# Patient Record
Sex: Female | Born: 1980
Health system: Southern US, Community
[De-identification: ages and names within clinical notes are randomized; demographics above are authoritative.]

## PROBLEM LIST (undated history)

## (undated) ENCOUNTER — Inpatient Hospital Stay (HOSPITAL_COMMUNITY): Payer: Self-pay

## (undated) DIAGNOSIS — Z789 Other specified health status: Secondary | ICD-10-CM

## (undated) DIAGNOSIS — M779 Enthesopathy, unspecified: Principal | ICD-10-CM

## (undated) HISTORY — DX: Enthesopathy, unspecified: M77.9

## (undated) HISTORY — PX: NO PAST SURGERIES: SHX2092

---

## 2011-03-05 ENCOUNTER — Emergency Department (HOSPITAL_BASED_OUTPATIENT_CLINIC_OR_DEPARTMENT_OTHER)
Admission: EM | Admit: 2011-03-05 | Discharge: 2011-03-05 | Disposition: A | Discharge status: Home / Self Care | Payer: No Typology Code available for payment source | Attending: Emergency Medicine | Admitting: Emergency Medicine

## 2011-03-05 ENCOUNTER — Emergency Department (INDEPENDENT_AMBULATORY_CARE_PROVIDER_SITE_OTHER): Payer: No Typology Code available for payment source

## 2011-03-05 DIAGNOSIS — Y92410 Unspecified street and highway as the place of occurrence of the external cause: Secondary | ICD-10-CM | POA: Insufficient documentation

## 2011-03-05 DIAGNOSIS — S139XXA Sprain of joints and ligaments of unspecified parts of neck, initial encounter: Secondary | ICD-10-CM | POA: Insufficient documentation

## 2011-03-05 DIAGNOSIS — M25529 Pain in unspecified elbow: Secondary | ICD-10-CM | POA: Insufficient documentation

## 2011-03-05 DIAGNOSIS — F17200 Nicotine dependence, unspecified, uncomplicated: Secondary | ICD-10-CM | POA: Insufficient documentation

## 2011-03-05 DIAGNOSIS — M25519 Pain in unspecified shoulder: Secondary | ICD-10-CM | POA: Insufficient documentation

## 2011-03-05 DIAGNOSIS — M542 Cervicalgia: Secondary | ICD-10-CM

## 2011-03-05 DIAGNOSIS — M79609 Pain in unspecified limb: Secondary | ICD-10-CM

## 2012-09-19 ENCOUNTER — Encounter (HOSPITAL_BASED_OUTPATIENT_CLINIC_OR_DEPARTMENT_OTHER): Payer: Self-pay | Admitting: *Deleted

## 2012-09-19 ENCOUNTER — Emergency Department (HOSPITAL_BASED_OUTPATIENT_CLINIC_OR_DEPARTMENT_OTHER): Payer: Self-pay

## 2012-09-19 ENCOUNTER — Emergency Department (HOSPITAL_BASED_OUTPATIENT_CLINIC_OR_DEPARTMENT_OTHER)
Admission: EM | Admit: 2012-09-19 | Discharge: 2012-09-19 | Disposition: A | Discharge status: Home / Self Care | Payer: Self-pay | Attending: Emergency Medicine | Admitting: Emergency Medicine

## 2012-09-19 DIAGNOSIS — M545 Low back pain, unspecified: Secondary | ICD-10-CM | POA: Insufficient documentation

## 2012-09-19 DIAGNOSIS — O2341 Unspecified infection of urinary tract in pregnancy, first trimester: Secondary | ICD-10-CM

## 2012-09-19 DIAGNOSIS — Z3490 Encounter for supervision of normal pregnancy, unspecified, unspecified trimester: Secondary | ICD-10-CM

## 2012-09-19 DIAGNOSIS — R110 Nausea: Secondary | ICD-10-CM | POA: Insufficient documentation

## 2012-09-19 DIAGNOSIS — O2390 Unspecified genitourinary tract infection in pregnancy, unspecified trimester: Secondary | ICD-10-CM | POA: Insufficient documentation

## 2012-09-19 DIAGNOSIS — F17200 Nicotine dependence, unspecified, uncomplicated: Secondary | ICD-10-CM | POA: Insufficient documentation

## 2012-09-19 LAB — WET PREP, GENITAL
Trich, Wet Prep: NONE SEEN
Yeast Wet Prep HPF POC: NONE SEEN

## 2012-09-19 LAB — URINALYSIS, ROUTINE W REFLEX MICROSCOPIC
Bilirubin Urine: NEGATIVE
Glucose, UA: NEGATIVE mg/dL
Ketones, ur: 15 mg/dL — AB
pH: 6.5 (ref 5.0–8.0)

## 2012-09-19 LAB — URINE MICROSCOPIC-ADD ON

## 2012-09-19 MED ORDER — NITROFURANTOIN MONOHYD MACRO 100 MG PO CAPS: 100.0000 mg | ORAL_CAPSULE | Freq: Two times a day (BID) | ORAL | Status: DC

## 2012-09-19 MED ORDER — ONDANSETRON HCL 4 MG PO TABS: 4.0000 mg | ORAL_TABLET | Freq: Four times a day (QID) | ORAL | Status: DC

## 2012-09-19 NOTE — ED Notes (Signed)
States she had a positive pregnancy test this am. C.o back pain in the mornings when she wakes up x 4 days.

## 2012-09-19 NOTE — ED Provider Notes (Addendum)
History     CSN: [id number]  Arrival date & time 09/19/12  1733   First MD Initiated Contact with Patient 09/19/12 1741      Chief Complaint  Patient presents with  . Abdominal Pain    (Consider location/radiation/quality/duration/timing/severity/associated sxs/prior treatment) Patient is a 31 y.o. female presenting with abdominal pain. The history is provided by the patient.  Abdominal Pain The primary symptoms of the illness include abdominal pain and nausea. The primary symptoms of the illness do not include vomiting, dysuria, vaginal discharge or vaginal bleeding. Episode onset: 4 days ago. The onset of the illness was gradual. Progression since onset: intermittent.  Pain Location: pelvic area. The abdominal pain does not radiate. The severity of the abdominal pain is 5/10. The abdominal pain is relieved by nothing. Exacerbated by: nothing.  The patient states that she believes she is currently pregnant. Additional symptoms associated with the illness include anorexia and back pain. Symptoms associated with the illness do not include chills, constipation, urgency or frequency.    History reviewed. No pertinent past medical history.  History reviewed. No pertinent past surgical history.  No family history on file.  History  Substance Use Topics  . Smoking status: Current Every Day Smoker -- 0.5 packs/day    Types: Cigarettes  . Smokeless tobacco: Not on file  . Alcohol Use: Yes    OB History    Grav Para Term Preterm Abortions TAB SAB Ect Mult Living   1               Review of Systems  Constitutional: Negative for chills.  Gastrointestinal: Positive for nausea, abdominal pain and anorexia. Negative for vomiting and constipation.  Genitourinary: Negative for dysuria, urgency, frequency, vaginal bleeding and vaginal discharge.  Musculoskeletal: Positive for back pain.  All other systems reviewed and are negative.    Allergies  Review of patient's allergies  indicates no known allergies.  Home Medications  No current outpatient prescriptions on file.  BP 121/73  Pulse 86  Temp 98.4 F (36.9 C) (Oral)  Resp 20  SpO2 100%  LMP 08/07/2012  Physical Exam  Nursing note and vitals reviewed. Constitutional: She is oriented to person, place, and time. She appears well-developed and well-nourished. No distress.  HENT:  Head: Normocephalic and atraumatic.  Mouth/Throat: Oropharynx is clear and moist.  Eyes: Conjunctivae normal and EOM are normal. Pupils are equal, round, and reactive to light.  Neck: Normal range of motion. Neck supple.  Cardiovascular: Normal rate, regular rhythm and intact distal pulses.   No murmur heard. Pulmonary/Chest: Effort normal and breath sounds normal. No respiratory distress. She has no wheezes. She has no rales.  Abdominal: Soft. She exhibits no distension. There is no tenderness. There is no rebound, no guarding and no CVA tenderness.       No reproducible abd pain  Genitourinary: Vagina normal and uterus normal. Cervix exhibits no motion tenderness, no discharge and no friability. Right adnexum displays no mass, no tenderness and no fullness. Left adnexum displays no mass, no tenderness and no fullness. No vaginal discharge found.  Musculoskeletal: Normal range of motion. She exhibits no edema and no tenderness.       Lumbar back: She exhibits tenderness.       Back:       Mild tenderness with palpation in the paralumbar region  Neurological: She is alert and oriented to person, place, and time.  Skin: Skin is warm and dry. No rash noted. No erythema.  Psychiatric:  She has a normal mood and affect. Her behavior is normal.    ED Course  Procedures (including critical care time)  Labs Reviewed  URINALYSIS, ROUTINE W REFLEX MICROSCOPIC - Abnormal; Notable for the following:    APPearance CLOUDY (*)     Ketones, ur 15 (*)     Leukocytes, UA SMALL (*)     All other components within normal limits    PREGNANCY, URINE - Abnormal; Notable for the following:    Preg Test, Ur POSITIVE (*)     All other components within normal limits  WET PREP, GENITAL - Abnormal; Notable for the following:    Clue Cells Wet Prep HPF POC MODERATE (*)     WBC, Wet Prep HPF POC RARE (*)     All other components within normal limits  HCG, QUANTITATIVE, PREGNANCY - Abnormal; Notable for the following:    hCG, Beta Chain, Quant, S 19290 (*)     All other components within normal limits  URINE MICROSCOPIC-ADD ON - Abnormal; Notable for the following:    Squamous Epithelial / LPF FEW (*)     All other components within normal limits  GC/CHLAMYDIA PROBE AMP, GENITAL   US Ob Comp Less 14 Wks  09/19/2012  *RADIOLOGY REPORT*  Clinical Data: Abdominal cramping.  Positive pregnancy test.  OBSTETRIC <14 WK ULTRASOUND, TRANSVAGINAL OB US  Technique:  Transabdominal and transvaginal ultrasound was performed for evaluation of the gestation as well as the maternal uterus and adnexal regions.  Number of gestation: 1 Heart Rate: 155 bpm  CRL:  3.7 mm         6w  0d                [place] EDC: 05/15/2013  Maternal uterus/adnexae: No subchorionic hemorrhage.  The left ovary is normal measuring 2.2 x 1.2 x 1.9 cm.  The right ovary also appears normal measuring 4.6 x 2.5 x 2.3 cm.  Fibroid noted within the anterior fundus measuring 1.9 x 0.9 x 2.0 cm.  No free fluid.  IMPRESSION:  1.  Single living intrauterine gestation with an estimated gestational age of [redacted] weeks and 0 days.   Original Report Authenticated By: Rosealee Albee, M.D.    US Ob Transvaginal  09/19/2012  *RADIOLOGY REPORT*  Clinical Data: Abdominal cramping.  Positive pregnancy test.  OBSTETRIC <14 WK ULTRASOUND, TRANSVAGINAL OB US  Technique:  Transabdominal and transvaginal ultrasound was performed for evaluation of the gestation as well as the maternal uterus and adnexal regions.  Number of gestation: 1 Heart Rate: 155 bpm  CRL:  3.7 mm         6w  0d                [place]  EDC: 05/15/2013  Maternal uterus/adnexae: No subchorionic hemorrhage.  The left ovary is normal measuring 2.2 x 1.2 x 1.9 cm.  The right ovary also appears normal measuring 4.6 x 2.5 x 2.3 cm.  Fibroid noted within the anterior fundus measuring 1.9 x 0.9 x 2.0 cm.  No free fluid.  IMPRESSION:  1.  Single living intrauterine gestation with an estimated gestational age of [redacted] weeks and 0 days.   Original Report Authenticated By: Rosealee Albee, M.D.      1. UTI (urinary tract infection) in pregnancy in first trimester   2. Pregnancy       MDM   Patient complaining of abdominal cramping and lower back pain that started 4 days ago. She  took a pregnancy test this a.m. and it was positive. Last normal menses was 08/10/2012.   Patient denies any vaginal bleeding, discharge or dysuria. Her exam is benign today. Unable to see the fetus by bedside abdominal ultrasound.  UA, UPT, wet prep, GC Chlamydia, hCG, transvaginal ultrasound pending.   6:30 PM Pelvic exam unrevealing. UA with small leukocytes and 3-6 white blood cells be given she is pregnant we'll treat for UTI.  U/s showed single IUP with fetal heart rate noted. Pt given OB f/u and started on macrobid      Gwyneth Sprout, MD 09/19/12 1945  Gwyneth Sprout, MD 09/19/12 [id number]

## 2012-09-20 LAB — GC/CHLAMYDIA PROBE AMP, GENITAL
Chlamydia, DNA Probe: NEGATIVE
GC Probe Amp, Genital: NEGATIVE

## 2013-08-27 ENCOUNTER — Encounter (HOSPITAL_BASED_OUTPATIENT_CLINIC_OR_DEPARTMENT_OTHER): Payer: Self-pay | Admitting: *Deleted

## 2013-08-27 ENCOUNTER — Emergency Department (HOSPITAL_BASED_OUTPATIENT_CLINIC_OR_DEPARTMENT_OTHER)
Admission: EM | Admit: 2013-08-27 | Discharge: 2013-08-27 | Disposition: A | Discharge status: Home / Self Care | Payer: Medicaid Other | Attending: Emergency Medicine | Admitting: Emergency Medicine

## 2013-08-27 ENCOUNTER — Emergency Department (HOSPITAL_BASED_OUTPATIENT_CLINIC_OR_DEPARTMENT_OTHER): Payer: Medicaid Other

## 2013-08-27 DIAGNOSIS — M549 Dorsalgia, unspecified: Secondary | ICD-10-CM

## 2013-08-27 DIAGNOSIS — M546 Pain in thoracic spine: Secondary | ICD-10-CM | POA: Insufficient documentation

## 2013-08-27 DIAGNOSIS — R0602 Shortness of breath: Secondary | ICD-10-CM | POA: Insufficient documentation

## 2013-08-27 DIAGNOSIS — F17200 Nicotine dependence, unspecified, uncomplicated: Secondary | ICD-10-CM | POA: Insufficient documentation

## 2013-08-27 NOTE — ED Provider Notes (Signed)
CSN: [id number]     Arrival date & time 08/27/13  1253 History   First MD Initiated Contact with Patient 08/27/13 1303     Chief Complaint  Patient presents with  . Back Pain   (Consider location/radiation/quality/duration/timing/severity/associated sxs/prior Treatment) HPI Comments: 32 yo female with no significant medical hx, pt smoker presents with back pain and sob.  Pt has had upper back ache for one week, worse with movement, no injuries recalled.  The past two days she feels it is more difficulty with breathing only when she lies down.  No recent infection.  No blood clot or cardiac hx.  No cp.  Patient denies blood clot history, active cancer, recent major trauma or surgery, unilateral leg swelling/ pain, recent long travel, hemoptysis.    Patient is a 32 y.o. female presenting with back pain. The history is provided by the patient.  Back Pain Location:  Thoracic spine Associated symptoms: no abdominal pain, no chest pain, no dysuria, no fever and no headaches     History reviewed. No pertinent past medical history. History reviewed. No pertinent past surgical history. History reviewed. No pertinent family history. History  Substance Use Topics  . Smoking status: Current Every Day Smoker -- 0.50 packs/day    Types: Cigarettes  . Smokeless tobacco: Not on file  . Alcohol Use: No   OB History   Grav Para Term Preterm Abortions TAB SAB Ect Mult Living   1              Review of Systems  Constitutional: Negative for fever and chills.  HENT: Negative for neck pain and neck stiffness.   Eyes: Negative for visual disturbance.  Respiratory: Positive for shortness of breath.   Cardiovascular: Negative for chest pain.  Gastrointestinal: Negative for vomiting and abdominal pain.  Genitourinary: Negative for dysuria and flank pain.  Musculoskeletal: Positive for back pain.  Skin: Negative for rash.  Neurological: Negative for light-headedness and headaches.    Allergies   Review of patient's allergies indicates no known allergies.  Home Medications   Current Outpatient Rx  Name  Route  Sig  Dispense  Refill  . ibuprofen (ADVIL,MOTRIN) 400 MG tablet   Oral   Take 400 mg by mouth every 6 (six) hours as needed for pain.          BP 133/83  Pulse 78  Temp(Src) 98.3 F (36.8 C) (Oral)  Resp 16  Ht 5\' 3"  (1.6 m)  Wt 145 lb (65.772 kg)  BMI 25.69 kg/m2  SpO2 95%  LMP 08/05/2012  Breastfeeding? Unknown Physical Exam  Nursing note and vitals reviewed. Constitutional: She is oriented to person, place, and time. She appears well-developed and well-nourished.  HENT:  Head: Normocephalic and atraumatic.  Eyes: Conjunctivae are normal. Right eye exhibits no discharge. Left eye exhibits no discharge.  Neck: Normal range of motion. Neck supple. No tracheal deviation present.  Cardiovascular: Normal rate and regular rhythm.   Pulmonary/Chest: Effort normal and breath sounds normal.  Abdominal: Soft. She exhibits no distension. There is no tenderness. There is no guarding.  Musculoskeletal: She exhibits tenderness (mild paraspinal thoracic). She exhibits no edema.  Neurological: She is alert and oriented to person, place, and time. She has normal strength. GCS eye subscore is 4. GCS verbal subscore is 5. GCS motor subscore is 6.  5+ strength in UE and LE with f/e at major joints. Sensation to palpation intact in UE and LE  Skin: Skin is warm. No rash noted.  Psychiatric: She has a normal mood and affect.    ED Course  Procedures (including critical care time) Labs Review Labs Reviewed - No data to display Imaging Review No results found.  MDM  No diagnosis found. Sxs only when lying flat.   Likely msk with pt overall health except smoking hx.   Pt has no cp or sob in ED.l  Vitals normal.  CXR no acute findings, PERC neg, no risks for PE. Discussed if patient sxs persist or worsen she will need further eval for PE however at this time suspicion  very  Low/ pretest prob very low.  Close fup stressed, pt comfortable with plan.  Back strain thoracic DC   Enid Skeens, MD 08/31/13 2119

## 2013-08-27 NOTE — ED Notes (Signed)
Pt c/o back pain and SOB x 1 week

## 2013-10-05 ENCOUNTER — Ambulatory Visit: Payer: Medicaid Other | Admitting: Internal Medicine

## 2014-09-27 ENCOUNTER — Encounter (HOSPITAL_BASED_OUTPATIENT_CLINIC_OR_DEPARTMENT_OTHER): Payer: Self-pay | Admitting: *Deleted

## 2015-04-16 ENCOUNTER — Emergency Department (HOSPITAL_BASED_OUTPATIENT_CLINIC_OR_DEPARTMENT_OTHER): Payer: Medicaid Other

## 2015-04-16 ENCOUNTER — Encounter (HOSPITAL_BASED_OUTPATIENT_CLINIC_OR_DEPARTMENT_OTHER): Payer: Self-pay

## 2015-04-16 ENCOUNTER — Emergency Department (HOSPITAL_BASED_OUTPATIENT_CLINIC_OR_DEPARTMENT_OTHER)
Admission: EM | Admit: 2015-04-16 | Discharge: 2015-04-16 | Disposition: A | Discharge status: Home / Self Care | Payer: Medicaid Other | Attending: Emergency Medicine | Admitting: Emergency Medicine

## 2015-04-16 DIAGNOSIS — M79671 Pain in right foot: Secondary | ICD-10-CM | POA: Diagnosis not present

## 2015-04-16 DIAGNOSIS — Z720 Tobacco use: Secondary | ICD-10-CM | POA: Diagnosis not present

## 2015-04-16 DIAGNOSIS — M79674 Pain in right toe(s): Secondary | ICD-10-CM | POA: Diagnosis present

## 2015-04-16 MED ORDER — ACETAMINOPHEN 325 MG PO TABS
975.0000 mg | ORAL_TABLET | Freq: Once | ORAL | Status: AC
  Administered 2015-04-16: 975 mg via ORAL
  Filled 2015-04-16: qty 3

## 2015-04-16 NOTE — ED Provider Notes (Signed)
CSN: [id number]     Arrival date & time 04/16/15  1357 History   First MD Initiated Contact with Patient 04/16/15 1512     Chief Complaint  Patient presents with  . Toe Pain     (Consider location/radiation/quality/duration/timing/severity/associated sxs/prior Treatment) HPI   Holly Donaldson is a 34 y.o. female complaining of severe right great toe pain on the bottom of the foot starting 2 weeks ago exacerbated by weightbearing. Taking patient has been taking ibuprofen at home which relieves the issue temporarily. She denies any specific trauma she, she rates the pain at 8 out of 10, describes it as aching. No prior history of trauma or surgeries to the affected joint, no rashes or lesions over the area.  History reviewed. No pertinent past medical history. History reviewed. No pertinent past surgical history. No family history on file. History  Substance Use Topics  . Smoking status: Current Every Day Smoker -- 0.50 packs/day    Types: Cigarettes  . Smokeless tobacco: Not on file  . Alcohol Use: No   OB History    Gravida Para Term Preterm AB TAB SAB Ectopic Multiple Living   1              Review of Systems  10 systems reviewed and found to be negative, except as noted in the HPI.   Allergies  Review of patient's allergies indicates no known allergies.  Home Medications   Prior to Admission medications   Medication Sig Start Date End Date Taking? Authorizing Provider  ibuprofen (ADVIL,MOTRIN) 400 MG tablet Take 400 mg by mouth every 6 (six) hours as needed for pain.    Historical Provider, MD   BP 144/83 mmHg  Pulse 59  Temp(Src) 98 F (36.7 C) (Oral)  Resp 16  Ht 5\' 3"  (1.6 m)  Wt 141 lb (63.957 kg)  BMI 24.98 kg/m2  SpO2 100%  Breastfeeding? No Physical Exam  Constitutional: She is oriented to person, place, and time. She appears well-developed and well-nourished. No distress.  HENT:  Head: Normocephalic.  Eyes: Conjunctivae and EOM are normal.   Cardiovascular: Normal rate.   Pulmonary/Chest: Effort normal. No stridor.  Musculoskeletal: Normal range of motion. She exhibits no edema or tenderness.  Right great toe with no erythema, swelling, warmth, minimally tender to palpation on the underside of the MTP.  Neurovascularly intact  Neurological: She is alert and oriented to person, place, and time.  Psychiatric: She has a normal mood and affect.  Nursing note and vitals reviewed.   ED Course  Procedures (including critical care time) Labs Review Labs Reviewed - No data to display  Imaging Review Dg Toe Great Right  04/16/2015   CLINICAL DATA:  Right first toe pain.  EXAM: RIGHT GREAT TOE  COMPARISON:  None.  FINDINGS: No fracture, dislocation or significant arthropathy. No soft tissue findings. No bony lesion or destruction.  IMPRESSION: Normal right first toe.   Electronically Signed   By: Irish LackGlenn  Yamagata M.D.   On: 04/16/2015 14:57     EKG Interpretation None      MDM   Final diagnoses:  Foot pain, right    Filed Vitals:   04/16/15 1430  BP: 144/83  Pulse: 59  Temp: 98 F (36.7 C)  TempSrc: Oral  Resp: 16  Height: 5\' 3"  (1.6 m)  Weight: 141 lb (63.957 kg)  SpO2: 100%    Medications  acetaminophen (TYLENOL) tablet 975 mg (975 mg Oral Given 04/16/15 1536)    Holly Donaldson  is a pleasant 34 y.o. female presenting with traumatic right great MTP pain worse upon weightbearing. Patient is neurovascular intact, no overlying skin changes, no exquisite tenderness or warmth to suggest gout. Patient will be given crutches, advised high-dose NSAIDs, acetaminophen and podiatry referral.  Evaluation does not show pathology that would require ongoing emergent intervention or inpatient treatment. Pt is hemodynamically stable and mentating appropriately. Discussed findings and plan with patient/guardian, who agrees with care plan. All questions answered. Return precautions discussed and outpatient follow up given.        Wynetta Emery, PA-C 04/16/15 1536  Arby Barrette, MD 04/19/15 1435

## 2015-04-16 NOTE — ED Notes (Signed)
Pt reports 2 weeks of right great toe pain, mild swelling, no erythema, denies any injury, ambulatory without difficulty.

## 2015-04-16 NOTE — Discharge Instructions (Signed)
Rest, Ice intermittently (in the first 24-48 hours), Gentle compression with an Ace wrap, and elevate (Limb above the level of the heart)   Take up to 800mg  of ibuprofen (that is usually 4 over the counter pills)  3 times a day for 5 days. Take with food.  Take acetaminophen (Tylenol) up to 975 mg (this is normally 3 over-the-counter pills) up to 3 times a day. Do not drink alcohol. Make sure your other medications do not contain acetaminophen (Read the labels!)  Please follow with your primary care doctor in the next 2 days for a check-up. They must obtain records for further management.   Do not hesitate to return to the Emergency Department for any new, worsening or concerning symptoms.

## 2015-04-20 ENCOUNTER — Encounter: Payer: Self-pay | Admitting: Podiatry

## 2015-04-20 ENCOUNTER — Ambulatory Visit (INDEPENDENT_AMBULATORY_CARE_PROVIDER_SITE_OTHER): Payer: Medicaid Other | Admitting: Podiatry

## 2015-04-20 VITALS — BP 137/72 | HR 75 | Ht 65.0 in | Wt 133.0 lb

## 2015-04-20 DIAGNOSIS — M79673 Pain in unspecified foot: Secondary | ICD-10-CM | POA: Diagnosis not present

## 2015-04-20 DIAGNOSIS — R609 Edema, unspecified: Secondary | ICD-10-CM

## 2015-04-20 DIAGNOSIS — M779 Enthesopathy, unspecified: Principal | ICD-10-CM

## 2015-04-20 DIAGNOSIS — M79606 Pain in leg, unspecified: Secondary | ICD-10-CM | POA: Insufficient documentation

## 2015-04-20 DIAGNOSIS — M778 Other enthesopathies, not elsewhere classified: Secondary | ICD-10-CM

## 2015-04-20 DIAGNOSIS — M79604 Pain in right leg: Secondary | ICD-10-CM

## 2015-04-20 DIAGNOSIS — M21969 Unspecified acquired deformity of unspecified lower leg: Secondary | ICD-10-CM | POA: Diagnosis not present

## 2015-04-20 DIAGNOSIS — R600 Localized edema: Secondary | ICD-10-CM

## 2015-04-20 HISTORY — DX: Other enthesopathies, not elsewhere classified: M77.8

## 2015-04-20 NOTE — Progress Notes (Signed)
Subjective: 34 year old female presents complaining of sharp shooting type pain on right foot between 1st and 2nd metatarsal joints with ambulation x 2 week.  Patient recalls no injury. No pain at this time since she is not putting any weight.  Been checked out at ER and discharged without conclusive findings. X-ray result was negative.  Work in [employer] and walks back and forth during 8 hour shift.   Review of Systems - Negative.  Objective:  Dermatologic: Normal skin and no open lesions noted. Neurologic: All epicritic and tactile sensations are grossly intact. Pain with at the first inter metatarsal space with joint motion right foot. Vascular: All pedal pulses are palpable. Positive of mild forefoot edema right without color change. Orthopedic: No gross deformities noted.  Assessment: Possible intrinsic muscle or ligament injury first intermetatarsal space right foot. Capsulitis first MPJ right.   Plan: Reviewed findings and available options. Stay in Pneumatic CAM walker and limit ambulation x 1 week. Return in one week to see if she can return to work.

## 2015-04-20 NOTE — Patient Instructions (Signed)
Seen for pain in right foot. Placed in CAM air walker. Return in one week to assess if able to return to work.

## 2015-04-27 ENCOUNTER — Encounter: Payer: Self-pay | Admitting: Podiatry

## 2015-04-27 ENCOUNTER — Ambulatory Visit (INDEPENDENT_AMBULATORY_CARE_PROVIDER_SITE_OTHER): Payer: Medicaid Other | Admitting: Podiatry

## 2015-04-27 VITALS — BP 122/71 | HR 99

## 2015-04-27 DIAGNOSIS — M79604 Pain in right leg: Secondary | ICD-10-CM | POA: Diagnosis not present

## 2015-04-27 DIAGNOSIS — M779 Enthesopathy, unspecified: Principal | ICD-10-CM

## 2015-04-27 DIAGNOSIS — M216X1 Other acquired deformities of right foot: Secondary | ICD-10-CM

## 2015-04-27 DIAGNOSIS — M778 Other enthesopathies, not elsewhere classified: Secondary | ICD-10-CM

## 2015-04-27 MED ORDER — ACETAMINOPHEN-CODEINE #3 300-30 MG PO TABS: 1.0000 | ORAL_TABLET | ORAL | Status: DC | PRN

## 2015-04-27 NOTE — Progress Notes (Signed)
Subjective: 34 year old female presents for follow up on right foot pain. Stated that she is fine as long as she is on pneumatic CAM walker. Foot hurts if walking without the boot. She can return to work if her toes can be covered.   Objective: No change in findings. Dermatologic: Normal skin and no open lesions noted. Neurologic: All epicritic and tactile sensations are grossly intact. Pain with at the first inter metatarsal space with joint motion right foot. Vascular: All pedal pulses are palpable. Positive of mild forefoot edema right without color change. Orthopedic: No gross deformities noted.  Assessment: Possible intrinsic muscle or ligament injury first intermetatarsal space right foot. Capsulitis first MPJ right.  Improved condition with pneumatic CAM walker.   Plan: Stay in Pneumatic CAM walker for the next 4 weeks.  May return to work with toe covered.  Return to office if pain recur.

## 2015-08-07 ENCOUNTER — Encounter (HOSPITAL_BASED_OUTPATIENT_CLINIC_OR_DEPARTMENT_OTHER): Payer: Self-pay | Admitting: *Deleted

## 2015-08-07 ENCOUNTER — Emergency Department (HOSPITAL_BASED_OUTPATIENT_CLINIC_OR_DEPARTMENT_OTHER)
Admission: EM | Admit: 2015-08-07 | Discharge: 2015-08-07 | Disposition: A | Discharge status: Home / Self Care | Payer: Medicaid Other | Attending: Emergency Medicine | Admitting: Emergency Medicine

## 2015-08-07 DIAGNOSIS — Z720 Tobacco use: Secondary | ICD-10-CM | POA: Insufficient documentation

## 2015-08-07 DIAGNOSIS — M79642 Pain in left hand: Secondary | ICD-10-CM | POA: Diagnosis present

## 2015-08-07 DIAGNOSIS — Z8739 Personal history of other diseases of the musculoskeletal system and connective tissue: Secondary | ICD-10-CM | POA: Insufficient documentation

## 2015-08-07 DIAGNOSIS — G5602 Carpal tunnel syndrome, left upper limb: Secondary | ICD-10-CM | POA: Diagnosis not present

## 2015-08-07 MED ORDER — IBUPROFEN 200 MG PO TABS
600.0000 mg | ORAL_TABLET | Freq: Once | ORAL | Status: AC
  Administered 2015-08-07: 600 mg via ORAL
  Filled 2015-08-07 (×2): qty 1

## 2015-08-07 NOTE — ED Notes (Signed)
Reports left hand pain and numbness x 1 week- states aches at night and makes it difficult to sleep. 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 Reports similar Sx in right hand several months ago which resolved without treatment other than ibuprofen

## 2015-08-07 NOTE — ED Notes (Signed)
D/c home with ice pack for home use. Discharge resource guide provided

## 2015-08-07 NOTE — ED Provider Notes (Signed)
CSN: [id number]     Arrival date & time 08/07/15  0859 History   First MD Initiated Contact with Patient 08/07/15 [phone]     Chief Complaint  Patient presents with  . Hand Pain     HPI Patient presents to the emergency department complaining of left wrist and hand discomfort and pain.  She feels numbness in her thumb nail and index finger of her left hand.  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  Patient with previous symptoms in her right hand several months ago that resolved on its own.  No swelling of her left upper extremity.  No complaints of weakness in the left hand.  No other complaints.   Past Medical History  Diagnosis Date  . Capsulitis of right foot 04/20/2015   History reviewed. No pertinent past surgical history. No family history on file. Social History  Substance Use Topics  . Smoking status: Current Every Day Smoker -- 0.50 packs/day    Types: Cigarettes  . Smokeless tobacco: Never Used  . Alcohol Use: No   OB History    Gravida Para Term Preterm AB TAB SAB Ectopic Multiple Living   1              Review of Systems  All other systems reviewed and are negative.     Allergies  Review of patient's allergies indicates no known allergies.  Home Medications   Prior to Admission medications   Medication Sig Start Date End Date Taking? Authorizing Provider  acetaminophen-codeine (TYLENOL #3) 300-30 MG per tablet Take 1 tablet by mouth every 4 (four) hours as needed for moderate pain. 04/27/15   Myeong O Sheard, DPM  ibuprofen (ADVIL,MOTRIN) 400 MG tablet Take 400 mg by mouth every 6 (six) hours as needed for pain.    Historical Provider, MD   BP 131/83 mmHg  Pulse 83  Temp(Src) 98.7 F (37.1 C) (Oral)  Resp 16  Ht  (1.651 m)  Wt 127 lb (57.607 kg)  BMI 21.13 kg/m2  SpO2 99% Physical Exam  Constitutional: She is oriented to person, place, and time. She appears well-developed and well-nourished.  HENT:  Head: Normocephalic.  Eyes: EOM are  normal.  Neck: Normal range of motion.  Pulmonary/Chest: Effort normal.  Abdominal: She exhibits no distension.  Musculoskeletal: Normal range of motion.  Normal left radial pulse.  Normal grip strength of left hand.  Mild tenderness of the median nerve of the left.  No swelling of her left upper extremity as compared to her right.  All 5 fingers are perfused  Neurological: She is alert and oriented to person, place, and time.  Psychiatric: She has a normal mood and affect.  Nursing note and vitals reviewed.   ED Course  Procedures (including critical care time) Labs Review Labs Reviewed - No data to display  Imaging Review No results found. I have personally reviewed and evaluated these images and lab results as part of my medical decision-making.   EKG Interpretation None      MDM   Final diagnoses:  Carpal tunnel syndrome, left    Symptoms consistent with left-sided carpal tunnel.  Patient be placed in a cockup wrist splint.  Primary care follow-up.  She understands return to ER for new or worsening symptoms.  No signs of vascular compromise.  Doubt DVT.    Azalia Bilis, MD 08/07/15 [phone]

## 2015-08-07 NOTE — Discharge Instructions (Signed)
Carpal Tunnel Syndrome The carpal tunnel is a narrow area located on the palm side of your wrist. The tunnel is formed by the wrist bones and ligaments. Nerves, blood vessels, and tendons pass through the carpal tunnel. Repeated wrist motion or certain diseases may cause swelling within the tunnel. This swelling pinches the main nerve in the wrist (median nerve) and causes the painful hand and arm condition called carpal tunnel syndrome. CAUSES   Repeated wrist motions.  Wrist injuries.  Certain diseases like arthritis, diabetes, alcoholism, hyperthyroidism, and kidney failure.  Obesity.  Pregnancy. SYMPTOMS   A "pins and needles" feeling in your fingers or hand, especially in your thumb, index and middle fingers.  Tingling or numbness in your fingers or hand.  An aching feeling in your entire arm, especially when your wrist and elbow are bent for long periods of time.  Wrist pain that goes up your arm to your shoulder.  Pain that goes down into your palm or fingers.  A weak feeling in your hands. DIAGNOSIS  Your health care provider will take your history and perform a physical exam. An electromyography test may be needed. This test measures electrical signals sent out by your nerves into the muscles. The electrical signals are usually slowed by carpal tunnel syndrome. You may also need X-rays. TREATMENT  Carpal tunnel syndrome may clear up by itself. Your health care provider may recommend a wrist splint or medicine such as a nonsteroidal anti-inflammatory medicine. Cortisone injections may help. Sometimes, surgery may be needed to free the pinched nerve.  HOME CARE INSTRUCTIONS   Take all medicine as directed by your health care provider. Only take over-the-counter or prescription medicines for pain, discomfort, or fever as directed by your health care provider.  If you were given a splint to keep your wrist from bending, wear it as directed. It is important to wear the splint at  night. Wear the splint for as long as you have pain or numbness in your hand, arm, or wrist. This may take 1 to 2 months.  Rest your wrist from any activity that may be causing your pain. If your symptoms are work-related, you may need to talk to your employer about changing to a job that does not require using your wrist.  Put ice on your wrist after long periods of wrist activity.  Put ice in a plastic bag.  Place a towel between your skin and the bag.  Leave the ice on for 15-20 minutes, 03-04 times a day.  Keep all follow-up visits as directed by your health care provider. This includes any orthopedic referrals, physical therapy, and rehabilitation. Any delay in getting necessary care could result in a delay or failure of your condition to heal. SEEK IMMEDIATE MEDICAL CARE IF:   You have new, unexplained symptoms.  Your symptoms get worse and are not helped or controlled with medicines. MAKE SURE YOU:   Understand these instructions.  Will watch your condition.  Will get help right away if you are not doing well or get worse. Document Released: 11/09/2000 Document Revised: 03/29/2014 Document Reviewed: 09/28/2011 [employer] Patient Information 2015 [employer]. This information is not intended to replace advice given to you by your health care provider. Make sure you discuss any questions you have with your health care provider.  

## 2016-07-19 ENCOUNTER — Ambulatory Visit (INDEPENDENT_AMBULATORY_CARE_PROVIDER_SITE_OTHER): Payer: BLUE CROSS/BLUE SHIELD | Admitting: Podiatry

## 2016-07-19 ENCOUNTER — Encounter: Payer: Self-pay | Admitting: Podiatry

## 2016-07-19 VITALS — BP 127/83 | HR 87

## 2016-07-19 DIAGNOSIS — M778 Other enthesopathies, not elsewhere classified: Secondary | ICD-10-CM

## 2016-07-19 DIAGNOSIS — M7740 Metatarsalgia, unspecified foot: Secondary | ICD-10-CM

## 2016-07-19 DIAGNOSIS — M7750 Other enthesopathy of unspecified foot: Secondary | ICD-10-CM

## 2016-07-19 DIAGNOSIS — M79673 Pain in unspecified foot: Secondary | ICD-10-CM | POA: Diagnosis not present

## 2016-07-19 DIAGNOSIS — M79604 Pain in right leg: Secondary | ICD-10-CM

## 2016-07-19 DIAGNOSIS — M779 Enthesopathy, unspecified: Principal | ICD-10-CM

## 2016-07-19 DIAGNOSIS — M21969 Unspecified acquired deformity of unspecified lower leg: Secondary | ICD-10-CM

## 2016-07-19 DIAGNOSIS — M7741 Metatarsalgia, right foot: Secondary | ICD-10-CM

## 2016-07-19 MED ORDER — ACETAMINOPHEN-CODEINE #3 300-30 MG PO TABS: 1.0000 | ORAL_TABLET | ORAL | 0 refills | Status: DC | PRN

## 2016-07-19 NOTE — Progress Notes (Signed)
Subjective: 35 year old female presents complaining of pain under 2nd and 3rd metatarsal head area x 3 weeks, throbs with weight bearing. On feet 8 hours / day. Feels something is under the ball of the foot right.  Positive history of 1st MPJ capsulitis in June 2016.   Objective: No change in findings. Dermatologic: Normal skin and no open lesions noted. Neurologic: All epicritic and tactile sensations are grossly intact. Pain with at the first inter metatarsal space with joint motion right foot. Vascular: All pedal pulses are palpable. Positive of mild forefoot edema right without color change. Orthopedic: Positive of tight Achilles tendon on right, elevated first ray with forefoot loading. Pain under 2nd and 3rd MPJ area right with pressure.  Mild capsular edema noted on palpation.  Radiographic examination reveal short first ray (-3), mild enlarged medial eminence with fibular sesamoid position at 4. On lateral view, elevated first ray noted with normal CYMA line bilateral.   Assessment: Capsulitis 2nd and 3rd MPJ right. Lateral weight shifting due to hypermobile first ray. Short first ray bilateral. Metatarsalgia 2nd and 3rd right.  Plan: Reviewed findings and available treatment options. As per request, 2nd intermetatarsal space Injected with mixture of 4 mg Dexamethasone, 4 mg Triamcinolone, and 1 cc of 0.5% Marcaine plain. Patient tolerated well without difficulty.  Return for orthotic prep. Take pain medication if needed. Return in 2 weeks.

## 2016-07-19 NOTE — Patient Instructions (Signed)
Pain under the ball of right foot due to swollen joint. Findings reviewed. Cortisone injection given at the 2nd intermetatarsal space right. Need custom orthotics. Return in 2 weeks. Take pain medication as needed.

## 2016-08-02 ENCOUNTER — Encounter: Payer: Self-pay | Admitting: Podiatry

## 2016-08-02 ENCOUNTER — Ambulatory Visit (INDEPENDENT_AMBULATORY_CARE_PROVIDER_SITE_OTHER): Payer: BLUE CROSS/BLUE SHIELD | Admitting: Podiatry

## 2016-08-02 DIAGNOSIS — M7751 Other enthesopathy of right foot: Secondary | ICD-10-CM

## 2016-08-02 DIAGNOSIS — M778 Other enthesopathies, not elsewhere classified: Secondary | ICD-10-CM

## 2016-08-02 DIAGNOSIS — M79671 Pain in right foot: Secondary | ICD-10-CM

## 2016-08-02 DIAGNOSIS — M79604 Pain in right leg: Secondary | ICD-10-CM

## 2016-08-02 DIAGNOSIS — M7741 Metatarsalgia, right foot: Secondary | ICD-10-CM

## 2016-08-02 DIAGNOSIS — M779 Enthesopathy, unspecified: Principal | ICD-10-CM

## 2016-08-02 MED ORDER — OXYCODONE-ACETAMINOPHEN 7.5-325 MG PO TABS: 1.0000 | ORAL_TABLET | Freq: Four times a day (QID) | ORAL | 0 refills | Status: DC | PRN

## 2016-08-02 NOTE — Patient Instructions (Signed)
Follow up on bilateral foot pain. Need to wait on custom orthotics. Stronger pain medication prescribed. Return in 2 weeks.

## 2016-08-02 NOTE — Progress Notes (Signed)
Subjective: 35 year old female presents stating her foot swells 3 days out of a week after been on concrete floor all day.  Tylenol #3 does not take pain away. Been taking every 2-3 hours.  Pain under the ball of the foot is the same.  History: Been having pain under 2nd and 3rd metatarsal head area x 5 weeks, throbs with weight bearing. On feet 8 hours / day. Feels something is under the ball of the foot right.  Positive history of 1st MPJ capsulitis in June 2016.   Objective: No change in findings. Dermatologic: Normal skin and no open lesions noted. Neurologic: All epicritic and tactile sensations are grossly intact. Pain with at the first inter metatarsal space with joint motion right foot. Vascular: All pedal pulses are palpable. Positive of mild forefoot edema right without color change. Orthopedic: Positive of tight Achilles tendon on right, elevated first ray with forefoot loading. Pain under 2nd and 3rd MPJ area right with pressure.  Mild capsular edema noted on palpation.  Radiographic examination reveal short first ray (-3), mild enlarged medial eminence with fibular sesamoid position at 4. On lateral view, elevated first ray noted with normal CYMA line bilateral.   Assessment: Capsulitis 2nd and 3rd MPJ right. Lateral weight shifting due to hypermobile first ray. Short first ray bilateral. Metatarsalgia 2nd and 3rd right.  Plan: Reviewed findings and available treatment options. Return for orthotic prep. Take pain medication as needed. Return in 2 weeks.

## 2016-08-21 ENCOUNTER — Telehealth: Payer: Self-pay | Admitting: *Deleted

## 2016-08-21 NOTE — Telephone Encounter (Signed)
08/21/2016 Patient called this am and ask can she get a Rx filled for pain. She was last seen the 7th of September this year.

## 2016-08-28 ENCOUNTER — Ambulatory Visit (INDEPENDENT_AMBULATORY_CARE_PROVIDER_SITE_OTHER): Payer: BLUE CROSS/BLUE SHIELD | Admitting: Podiatry

## 2016-08-28 ENCOUNTER — Encounter: Payer: Self-pay | Admitting: Podiatry

## 2016-08-28 VITALS — BP 118/71 | HR 64

## 2016-08-28 DIAGNOSIS — M79604 Pain in right leg: Secondary | ICD-10-CM

## 2016-08-28 DIAGNOSIS — M778 Other enthesopathies, not elsewhere classified: Secondary | ICD-10-CM

## 2016-08-28 DIAGNOSIS — M79673 Pain in unspecified foot: Secondary | ICD-10-CM

## 2016-08-28 DIAGNOSIS — M7751 Other enthesopathy of right foot: Secondary | ICD-10-CM

## 2016-08-28 DIAGNOSIS — M21969 Unspecified acquired deformity of unspecified lower leg: Secondary | ICD-10-CM | POA: Diagnosis not present

## 2016-08-28 DIAGNOSIS — M2042 Other hammer toe(s) (acquired), left foot: Secondary | ICD-10-CM

## 2016-08-28 DIAGNOSIS — M779 Enthesopathy, unspecified: Secondary | ICD-10-CM

## 2016-08-28 NOTE — Patient Instructions (Signed)
Right foot pain is tolerable with pain medication. Pain and swelling on 5th toe left possible due to steel toed shoes. May use extra padding to protect 5th toe leftt in steel toe shoes. Return for orthotics or as needed.

## 2016-08-28 NOTE — Progress Notes (Signed)
Subjective: [age]year old female presents stating that new pain medication (Percocet 7.5/325) is working better. One pill lasts her all day till the end of the day and had to take another one in the evening when she gets home. Now her 5th toe left foot is swollen and painful x 2 days. This is the first time having this pain on left foot. Usually pain is on her right foot. Been wearing Steel toed shoes at work for over a year.  History:  Pain under 2nd and 3rd metatarsal head area x 2 month, worse with increased weight bearing. On feet 8 hours / day. Positive history of 1st MPJ capsulitis in June 2016.   Objective: No change in findings. Dermatologic: Swollen and painful 5th toe left.  Neurologic: All epicritic and tactile sensations are grossly intact. Pain with at the first inter metatarsal space with joint motion right foot. Vascular: All pedal pulses are palpable. Positive of enlarged and painful 5th digit left.  Orthopedic: Positive of tight Achilles tendon on right, elevated first ray with forefoot loading. Pain under 2nd and 3rd MPJ area right with pressure.  Enlarged 5th digit left.  Previous x-ray result was significant for short first ray (-3).  Assessment: Right foot pain tolerable with new pain medication Percocet 7.5/325 bid. Painful enlarged 5th toe left possible from shoe pressure with steel toed work shoes. Capsulitis 2nd and 3rd MPJ right  Lateral weight shifting due to hypermobile first ray. Short first ray bilateral. Metatarsalgia 2nd and 3rd right.  Plan: Reviewed findings on 5th toe left and available treatment options. Compression stockinet dispensed for 5th toe pain on left foot.  Still need Orthotic treatment on right foot pain.  Take pain medication as needed.   Patient will return in a few weeks to have orthotics prepared.

## 2016-08-31 ENCOUNTER — Telehealth: Payer: Self-pay | Admitting: *Deleted

## 2016-08-31 NOTE — Telephone Encounter (Signed)
08/31/16 Pt called this afternoon and says she will be out of medicine on Sunday morning, can we refill her pain medicine.

## 2016-09-03 MED ORDER — OXYCODONE-ACETAMINOPHEN 7.5-325 MG PO TABS: 1.0000 | ORAL_TABLET | Freq: Four times a day (QID) | ORAL | 0 refills | Status: DC | PRN

## 2016-09-24 ENCOUNTER — Telehealth: Payer: Self-pay | Admitting: *Deleted

## 2016-09-24 MED ORDER — OXYCODONE-ACETAMINOPHEN 7.5-325 MG PO TABS: 1.0000 | ORAL_TABLET | Freq: Four times a day (QID) | ORAL | 0 refills | Status: DC | PRN

## 2016-09-24 NOTE — Telephone Encounter (Signed)
09/24/16 Dr. Raynald KempSheard , Patient called this am and ask can she get a refill on pain meds.

## 2016-10-09 ENCOUNTER — Telehealth: Payer: Self-pay | Admitting: *Deleted

## 2016-10-09 MED ORDER — OXYCODONE-ACETAMINOPHEN 7.5-325 MG PO TABS: 1.0000 | ORAL_TABLET | Freq: Four times a day (QID) | ORAL | 0 refills | Status: DC | PRN

## 2016-10-09 NOTE — Telephone Encounter (Signed)
10/09/16 Patient called this afternoon and ask can she have a refill on her pain medication

## 2016-11-01 ENCOUNTER — Telehealth: Payer: Self-pay | Admitting: *Deleted

## 2016-11-01 MED ORDER — OXYCODONE-ACETAMINOPHEN 7.5-325 MG PO TABS: 1.0000 | ORAL_TABLET | Freq: Four times a day (QID) | ORAL | 0 refills | Status: DC | PRN

## 2016-11-01 NOTE — Telephone Encounter (Signed)
11/01/16 Patient called this afternoon and ask can she get a refill on her rx for Pain. Patient las office visit 08/28/16

## 2016-11-20 ENCOUNTER — Ambulatory Visit (INDEPENDENT_AMBULATORY_CARE_PROVIDER_SITE_OTHER): Payer: BLUE CROSS/BLUE SHIELD | Admitting: Podiatry

## 2016-11-20 ENCOUNTER — Encounter: Payer: Self-pay | Admitting: Podiatry

## 2016-11-20 VITALS — BP 131/76 | HR 80

## 2016-11-20 DIAGNOSIS — M79604 Pain in right leg: Secondary | ICD-10-CM | POA: Diagnosis not present

## 2016-11-20 DIAGNOSIS — M7751 Other enthesopathy of right foot: Secondary | ICD-10-CM | POA: Diagnosis not present

## 2016-11-20 DIAGNOSIS — M778 Other enthesopathies, not elsewhere classified: Secondary | ICD-10-CM

## 2016-11-20 DIAGNOSIS — M779 Enthesopathy, unspecified: Principal | ICD-10-CM

## 2016-11-20 DIAGNOSIS — M21969 Unspecified acquired deformity of unspecified lower leg: Secondary | ICD-10-CM | POA: Diagnosis not present

## 2016-11-20 MED ORDER — OXYCODONE-ACETAMINOPHEN 7.5-325 MG PO TABS: 1.0000 | ORAL_TABLET | Freq: Four times a day (QID) | ORAL | 0 refills | Status: DC | PRN

## 2016-11-20 MED ORDER — NABUMETONE 500 MG PO TABS: 500.0000 mg | ORAL_TABLET | Freq: Two times a day (BID) | ORAL | 1 refills | Status: DC

## 2016-11-20 NOTE — Patient Instructions (Signed)
Recurring pain on right. OTC orthotic dispensed. Pain medication and Relafen ordered. Return as needed.

## 2016-11-20 NOTE — Progress Notes (Signed)
Subjective: [age]year old female presents stating that her foot pain returned. Right foot is swelling on ball of foot at the end of day walking on concrete floor wearing steel toed shoes. Last injection helped. New pain medication also helped.  History:  Pain under 2nd and 3rd metatarsal head area since August 2017. worse with increased weight bearing. On feet 8 hours / day. Positive history of 1st MPJ capsulitis in June 2016.   Objective: No change in findings. Dermatologic: No abnormal findings. Neurologic: All epicritic and tactile sensations are grossly intact. Pain with at the first inter metatarsal space with joint motion right foot. Vascular: All pedal pulses are palpable. Positive of enlarged and painful 5th digit left.  Orthopedic: Positive of tight Achilles tendon on right, elevated first ray with forefoot loading. Pain under 2nd and 3rd MPJ area right with pressure.   Previous x-ray result was significant for short first ray (-3).  Assessment: Recurring right foot pain tolerable with new pain medication Percocet 7.5/325 bid. Capsulitis 2nd and 3rd MPJ right  Lateral weight shifting due to hypermobile first ray. Short first ray bilateral. Metatarsalgia 2nd and 3rd right.  Plan: Reviewed findings.  Dispensed OTC Orthotics. Rx Relafen and Percocet 7.5/325.  Return as needed.

## 2016-12-14 ENCOUNTER — Telehealth: Payer: Self-pay | Admitting: *Deleted

## 2016-12-14 MED ORDER — OXYCODONE-ACETAMINOPHEN 7.5-325 MG PO TABS: 1.0000 | ORAL_TABLET | Freq: Two times a day (BID) | ORAL | 0 refills | Status: DC | PRN

## 2016-12-14 NOTE — Telephone Encounter (Signed)
12/14/16 Patient called yesterday requesting a rx for foot pain and she can pick up she said.

## 2017-01-01 ENCOUNTER — Telehealth: Payer: Self-pay | Admitting: *Deleted

## 2017-01-01 MED ORDER — OXYCODONE-ACETAMINOPHEN 7.5-325 MG PO TABS: 1.0000 | ORAL_TABLET | Freq: Two times a day (BID) | ORAL | 0 refills | Status: DC | PRN

## 2017-01-01 NOTE — Telephone Encounter (Signed)
01/01/17 Patient called this am and ask can she have a refill on her prescription for pain. She did call yesterday afternoon also and I ask her to call back today.

## 2017-01-02 ENCOUNTER — Telehealth: Payer: Self-pay | Admitting: *Deleted

## 2017-01-02 NOTE — Telephone Encounter (Signed)
01/02/17 Patient called this am and said she took her RX to the Pharmacy yesterday, They would nor refill it because it is too early, they told her if Dr. called and says its alright to refill they will fill he rRX

## 2017-02-05 ENCOUNTER — Encounter (HOSPITAL_BASED_OUTPATIENT_CLINIC_OR_DEPARTMENT_OTHER): Payer: Self-pay | Admitting: *Deleted

## 2017-02-05 ENCOUNTER — Emergency Department (HOSPITAL_BASED_OUTPATIENT_CLINIC_OR_DEPARTMENT_OTHER)
Admission: EM | Admit: 2017-02-05 | Discharge: 2017-02-05 | Disposition: A | Discharge status: Home / Self Care | Payer: BLUE CROSS/BLUE SHIELD | Attending: Emergency Medicine | Admitting: Emergency Medicine

## 2017-02-05 DIAGNOSIS — F17210 Nicotine dependence, cigarettes, uncomplicated: Secondary | ICD-10-CM | POA: Diagnosis not present

## 2017-02-05 DIAGNOSIS — M67431 Ganglion, right wrist: Secondary | ICD-10-CM | POA: Insufficient documentation

## 2017-02-05 DIAGNOSIS — M79641 Pain in right hand: Secondary | ICD-10-CM | POA: Diagnosis present

## 2017-02-05 MED ORDER — IBUPROFEN 600 MG PO TABS: 600.0000 mg | ORAL_TABLET | Freq: Four times a day (QID) | ORAL | 0 refills | Status: DC | PRN

## 2017-02-05 MED FILL — IBUPROFEN 600 MG TABLET: 600 | 7 days supply | Qty: 30 | Fill #0

## 2017-02-05 NOTE — ED Triage Notes (Signed)
Knot on top of her right hand. She does a job with repetitive motion. States the knot started a while back but got bigger last night and caused numbness to her hand. Painful with movements.

## 2017-02-05 NOTE — ED Provider Notes (Signed)
[facility]   CSN: [id number] Arrival date & time: 02/05/17  1234     History   Chief Complaint Chief Complaint  Patient presents with  . Hand Pain    HPI Holly Donaldson is a 36 y.o. female with history of bilateral carpal tunnel who presents with a painful knot to her right wrist. Patient states that the knot has been there for a long time, however has gotten larger and more painful over the past few days. Patient reports having episodes of right hand numbness while sleeping last night. It improved after putting her carpal tunnel brace on her wrist. He is not taking any medications or done any other interventions for her symptoms. Patient has a job with many repetitive hand and wrist movements. Patient's pain in her wrist is worse with movement. No known injury or trauma.  HPI  Past Medical History:  Diagnosis Date  . Capsulitis of right foot 04/20/2015    Patient Active Problem List   Diagnosis Date Noted  . Capsulitis of right foot 04/20/2015  . Pain in lower limb 04/20/2015  . Edema of right foot 04/20/2015    History reviewed. No pertinent surgical history.  OB History    Gravida Para Term Preterm AB Living   1             SAB TAB Ectopic Multiple Live Births                   Home Medications    Prior to Admission medications   Medication Sig Start Date End Date Taking? Authorizing Provider  acetaminophen-codeine (TYLENOL #3) 300-30 MG tablet Take 1 tablet by mouth every 4 (four) hours as needed for moderate pain. 07/19/16   Myeong O Sheard, DPM  ibuprofen (ADVIL,MOTRIN) 600 MG tablet Take 1 tablet (600 mg total) by mouth every 6 (six) hours as needed. 02/05/17   Emi Holes, PA-C  nabumetone (RELAFEN) 500 MG tablet Take 1 tablet (500 mg total) by mouth 2 (two) times daily. 11/20/16   Myeong O Sheard, DPM  oxyCODONE-acetaminophen (PERCOCET) 7.5-325 MG tablet Take 1 tablet by mouth 2 (two) times daily as needed. 01/01/17   Myeong Christianne Dolin, DPM    Family History No family history on file.  Social History Social History  Substance Use Topics  . Smoking status: Current Every Day Smoker    Packs/day: 0.50    Types: Cigarettes  . Smokeless tobacco: Never Used  . Alcohol use No     Allergies   Patient has no known allergies.   Review of Systems Review of Systems  Constitutional: Negative for fever.  Musculoskeletal: Positive for arthralgias.  Skin: Negative for color change.     Physical Exam Updated Vital Signs BP 129/87 (BP Location: Right Arm)   Pulse 88   Temp 98.3 F (36.8 C) (Oral)   Resp 18   Ht 5\' 5"  (1.651 m)   Wt 61.7 kg   LMP 01/12/2017   SpO2 100%   BMI 22.63 kg/m   Physical Exam  Constitutional: She appears well-developed and well-nourished. No distress.  HENT:  Head: Normocephalic and atraumatic.  Mouth/Throat: Oropharynx is clear and moist. No oropharyngeal exudate.  Eyes: Conjunctivae are normal. Pupils are equal, round, and reactive to light. Right eye exhibits no discharge. Left eye exhibits no discharge. No scleral icterus.  Neck: Normal range of motion. Neck supple. No thyromegaly present.  Cardiovascular: Normal rate, regular rhythm, normal heart sounds and intact  distal pulses.  Exam reveals no gallop and no friction rub.   No murmur heard. Pulmonary/Chest: Effort normal and breath sounds normal. No stridor. No respiratory distress. She has no wheezes. She has no rales.  Abdominal: Soft. Bowel sounds are normal. She exhibits no distension. There is no tenderness. There is no rebound and no guarding.  Musculoskeletal: She exhibits no edema.       Right wrist: She exhibits no bony tenderness.       Arms: R hand: Pain in right wrist with wrist extension; flexion and extension, abduction and adduction intact of all digits, normal sensation, cap refill less than 2 seconds, radial pulse intact  Lymphadenopathy:    She has no cervical adenopathy.  Neurological: She is alert.  Coordination normal.  Skin: Skin is warm and dry. No rash noted. She is not diaphoretic. No pallor.  Psychiatric: She has a normal mood and affect.  Nursing note and vitals reviewed.    ED Treatments / Results  Labs (all labs ordered are listed, but only abnormal results are displayed) Labs Reviewed - No data to display  EKG  EKG Interpretation None       Radiology No results found.  Procedures Procedures (including critical care time)  Medications Ordered in ED Medications - No data to display   Initial Impression / Assessment and Plan / ED Course  I have reviewed the triage vital signs and the nursing notes.  Pertinent labs & imaging results that were available during my care of the patient were reviewed by me and considered in my medical decision making (see chart for details).     Patient with ganglion cyst to right wrist. Patient has a wrist splint at home. Advised wearing her splint to support. Will begin and said treatment. Supportive treatment discussed with ice. Follow-up to hand surgery for further evaluation. Return precautions discussed. Patient understands and agrees with plan. Patient vitals stable throughout ED course discharged in satisfactory condition.  Final Clinical Impressions(s) / ED Diagnoses   Final diagnoses:  Ganglion cyst of dorsum of right wrist    New Prescriptions Discharge Medication List as of 02/05/2017  2:04 PM       Emi HolesAlexandra M Azzure Garabedian, PA-C 02/05/17 1717    Doug SouSam Jacubowitz, MD 02/06/17 [phone]

## 2017-02-05 NOTE — Discharge Instructions (Signed)
Medications: Ibuprofen  Treatment: Take ibuprofen every 6 hours as needed for your pain. Use ice 3-4 times daily alternate 20 minutes on, 20 minutes off. Wear your wrist splint for support.  Follow-up: Please follow-up with the hand doctor, Dr. Amanda PeaGramig, for further evaluation and treatment of your symptoms. Please return to emergency department if you develop any new or worsening symptoms including swelling, redness, red streaking from the area, or any other concerning symptoms.

## 2017-02-07 ENCOUNTER — Telehealth: Payer: Self-pay | Admitting: *Deleted

## 2017-02-07 MED ORDER — OXYCODONE-ACETAMINOPHEN 7.5-325 MG PO TABS: 1.0000 | ORAL_TABLET | Freq: Two times a day (BID) | ORAL | 0 refills | Status: DC | PRN

## 2017-02-07 NOTE — Telephone Encounter (Signed)
02/07/17 Pt called this afternoon and ask can she get a RX for pain.

## 2017-03-07 ENCOUNTER — Telehealth: Payer: Self-pay | Admitting: *Deleted

## 2017-03-07 MED ORDER — OXYCODONE-ACETAMINOPHEN 7.5-325 MG PO TABS: 1.0000 | ORAL_TABLET | Freq: Two times a day (BID) | ORAL | 0 refills | Status: DC | PRN

## 2017-03-07 NOTE — Telephone Encounter (Signed)
03/07/17 Patient called this afternoon and requested a RX for pain.

## 2017-04-09 ENCOUNTER — Telehealth: Payer: Self-pay | Admitting: *Deleted

## 2017-04-09 NOTE — Telephone Encounter (Signed)
04/08/17 Patient called and left a message this morning and request a RX for Foot pain.

## 2017-04-11 ENCOUNTER — Ambulatory Visit (INDEPENDENT_AMBULATORY_CARE_PROVIDER_SITE_OTHER): Payer: BLUE CROSS/BLUE SHIELD | Admitting: Podiatry

## 2017-04-11 ENCOUNTER — Encounter: Payer: Self-pay | Admitting: Podiatry

## 2017-04-11 DIAGNOSIS — M79604 Pain in right leg: Secondary | ICD-10-CM | POA: Diagnosis not present

## 2017-04-11 DIAGNOSIS — M7751 Other enthesopathy of right foot: Secondary | ICD-10-CM

## 2017-04-11 DIAGNOSIS — M779 Enthesopathy, unspecified: Principal | ICD-10-CM

## 2017-04-11 DIAGNOSIS — M21969 Unspecified acquired deformity of unspecified lower leg: Secondary | ICD-10-CM

## 2017-04-11 DIAGNOSIS — M778 Other enthesopathies, not elsewhere classified: Secondary | ICD-10-CM

## 2017-04-11 MED ORDER — OXYCODONE-ACETAMINOPHEN 7.5-325 MG PO TABS: 1.0000 | ORAL_TABLET | Freq: Two times a day (BID) | ORAL | 0 refills | Status: DC | PRN

## 2017-04-11 MED ORDER — NABUMETONE 500 MG PO TABS: 500.0000 mg | ORAL_TABLET | Freq: Two times a day (BID) | ORAL | 1 refills | Status: AC

## 2017-04-11 NOTE — Progress Notes (Signed)
Subjective: [age]year old female presents stating that her feet hurt too much could not work since Monday. Patient points ball of right foot being the source of foot pain.  her foot pain returned. Right foot is swelling on ball of foot at the end of day walking on concrete floor wearing steel toed shoes. Last injection helped. New pain medication also helped.  History:  Pain under 2nd and 3rd metatarsal head area since August 2017. worse with increasedweight bearing. On feet 8 hours / day. Positive history of 1st MPJ capsulitis in June 2016.   Objective: No change in findings. Dermatologic: No abnormal findings. Neurologic: All epicritic and tactile sensations are grossly intact. Pain with at the first inter metatarsal space with joint motion right foot. Vascular: All pedal pulses are palpable. Positive of enlarged and painful 5th digit left.  Orthopedic: Positive of tight Achilles tendon on right, elevated first ray with forefoot loading. Pain under 2nd and 3rd MPJ area right with pressure.   Previous x-ray result was significant forshort first ray (-3).  Assessment: Recurring right foot pain tolerable with new pain medication Percocet 7.5/325 bid. Capsulitis 2nd and 3rd MPJ right  Lateral weight shifting due to hypermobile first ray. Short first ray bilateral. Metatarsalgia 2nd and 3rd right.  Plan: Reviewed findings.  Dispensed OTC Orthotics. Rx Relafen and Percocet 7.5/325. Return as needed.

## 2017-04-11 NOTE — Patient Instructions (Signed)
Seen for pain in right foot. Has abnormal weight shifting to lateral column and causing pain at lesser joints. Need custom orthotics with good tennis shoes. May take pain medication if needed. May benefit from surgical options.

## 2017-04-15 ENCOUNTER — Ambulatory Visit: Payer: BLUE CROSS/BLUE SHIELD | Admitting: Podiatry

## 2017-05-09 ENCOUNTER — Telehealth: Payer: Self-pay | Admitting: *Deleted

## 2017-05-09 MED ORDER — OXYCODONE-ACETAMINOPHEN 7.5-325 MG PO TABS: 1.0000 | ORAL_TABLET | Freq: Two times a day (BID) | ORAL | 0 refills | Status: DC | PRN

## 2017-05-09 NOTE — Telephone Encounter (Signed)
05/09/17 Patient called this afternoon and requested a RX for Pain.

## 2017-06-07 ENCOUNTER — Telehealth: Payer: Self-pay | Admitting: *Deleted

## 2017-06-07 NOTE — Telephone Encounter (Signed)
Pt requests med refill

## 2017-06-10 MED ORDER — OXYCODONE-ACETAMINOPHEN 7.5-325 MG PO TABS: 1.0000 | ORAL_TABLET | Freq: Two times a day (BID) | ORAL | 0 refills | Status: DC | PRN

## 2018-06-12 ENCOUNTER — Inpatient Hospital Stay (HOSPITAL_BASED_OUTPATIENT_CLINIC_OR_DEPARTMENT_OTHER)
Admission: EM | Admit: 2018-06-12 | Discharge: 2018-06-12 | Payer: Self-pay | Attending: Emergency Medicine | Admitting: Emergency Medicine

## 2018-06-12 ENCOUNTER — Other Ambulatory Visit: Payer: Self-pay

## 2018-06-12 ENCOUNTER — Emergency Department (HOSPITAL_BASED_OUTPATIENT_CLINIC_OR_DEPARTMENT_OTHER): Payer: Self-pay

## 2018-06-12 ENCOUNTER — Encounter (HOSPITAL_BASED_OUTPATIENT_CLINIC_OR_DEPARTMENT_OTHER): Payer: Self-pay | Admitting: Emergency Medicine

## 2018-06-12 DIAGNOSIS — O99331 Smoking (tobacco) complicating pregnancy, first trimester: Secondary | ICD-10-CM | POA: Insufficient documentation

## 2018-06-12 DIAGNOSIS — Z3A01 Less than 8 weeks gestation of pregnancy: Secondary | ICD-10-CM | POA: Insufficient documentation

## 2018-06-12 DIAGNOSIS — O00102 Left tubal pregnancy without intrauterine pregnancy: Secondary | ICD-10-CM

## 2018-06-12 DIAGNOSIS — O0000 Abdominal pregnancy without intrauterine pregnancy: Secondary | ICD-10-CM | POA: Insufficient documentation

## 2018-06-12 DIAGNOSIS — F17210 Nicotine dependence, cigarettes, uncomplicated: Secondary | ICD-10-CM | POA: Insufficient documentation

## 2018-06-12 HISTORY — DX: Other specified health status: Z78.9

## 2018-06-12 LAB — URINALYSIS, MICROSCOPIC (REFLEX)

## 2018-06-12 LAB — HEPATIC FUNCTION PANEL
ALT: 12 U/L (ref 0–44)
AST: 18 U/L (ref 15–41)
Albumin: 4.3 g/dL (ref 3.5–5.0)
Alkaline Phosphatase: 58 U/L (ref 38–126)
BILIRUBIN DIRECT: 0.1 mg/dL (ref 0.0–0.2)
BILIRUBIN INDIRECT: 0.3 mg/dL (ref 0.3–0.9)
TOTAL PROTEIN: 7.9 g/dL (ref 6.5–8.1)
Total Bilirubin: 0.4 mg/dL (ref 0.3–1.2)

## 2018-06-12 LAB — BASIC METABOLIC PANEL
Anion gap: 10 (ref 5–15)
BUN: 11 mg/dL (ref 6–20)
CHLORIDE: 100 mmol/L (ref 98–111)
CO2: 24 mmol/L (ref 22–32)
Calcium: 9 mg/dL (ref 8.9–10.3)
Creatinine, Ser: 0.74 mg/dL (ref 0.44–1.00)
Glucose, Bld: 101 mg/dL — ABNORMAL HIGH (ref 70–99)
POTASSIUM: 3.7 mmol/L (ref 3.5–5.1)
SODIUM: 134 mmol/L — AB (ref 135–145)

## 2018-06-12 LAB — CBC
HEMATOCRIT: 37.9 % (ref 36.0–46.0)
Hemoglobin: 13.3 g/dL (ref 12.0–15.0)
MCH: 31.7 pg (ref 26.0–34.0)
MCHC: 35.1 g/dL (ref 30.0–36.0)
MCV: 90.2 fL (ref 78.0–100.0)
PLATELETS: 220 10*3/uL (ref 150–400)
RBC: 4.2 MIL/uL (ref 3.87–5.11)
RDW: 12 % (ref 11.5–15.5)
WBC: 6.8 10*3/uL (ref 4.0–10.5)

## 2018-06-12 LAB — URINALYSIS, ROUTINE W REFLEX MICROSCOPIC
BILIRUBIN URINE: NEGATIVE
Glucose, UA: NEGATIVE mg/dL
KETONES UR: NEGATIVE mg/dL
Leukocytes, UA: NEGATIVE
NITRITE: NEGATIVE
Protein, ur: NEGATIVE mg/dL
SPECIFIC GRAVITY, URINE: 1.015 (ref 1.005–1.030)
pH: 6 (ref 5.0–8.0)

## 2018-06-12 LAB — WET PREP, GENITAL
SPERM: NONE SEEN
TRICH WET PREP: NONE SEEN
YEAST WET PREP: NONE SEEN

## 2018-06-12 LAB — HCG, QUANTITATIVE, PREGNANCY: hCG, Beta Chain, Quant, S: 4527 m[IU]/mL — ABNORMAL HIGH (ref <5)

## 2018-06-12 MED ORDER — OXYCODONE-ACETAMINOPHEN 5-325 MG PO TABS: 1.0000 | ORAL_TABLET | Freq: Four times a day (QID) | ORAL | 0 refills | Status: AC | PRN

## 2018-06-12 MED ORDER — METHOTREXATE INJECTION FOR WOMEN'S HOSPITAL
50.0000 mg/m2 | Freq: Once | INTRAMUSCULAR | Status: AC
  Administered 2018-06-12: 90 mg via INTRAMUSCULAR
  Filled 2018-06-12: qty 1.8

## 2018-06-12 MED ORDER — PROMETHAZINE HCL 25 MG PO TABS: 25.0000 mg | ORAL_TABLET | Freq: Four times a day (QID) | ORAL | 2 refills | Status: AC | PRN

## 2018-06-12 MED ORDER — OXYCODONE-ACETAMINOPHEN 5-325 MG PO TABS
2.0000 | ORAL_TABLET | Freq: Once | ORAL | Status: AC
  Administered 2018-06-12: 2 via ORAL
  Filled 2018-06-12: qty 2

## 2018-06-12 NOTE — ED Triage Notes (Signed)
Pt is [redacted] weeks pregnant and reports vaginal bleeding since Sunday with cramping.

## 2018-06-12 NOTE — MAU Provider Note (Addendum)
Chief Complaint: Vaginal Bleeding ([redacted] weeks pregnant)   First Provider Initiated Contact with Patient 06/12/18 2043      SUBJECTIVE HPI: Holly Donaldson is a 37 y.o. G3P2002 at [redacted]w[redacted]d by LMP who presents to maternity admissions sent from [facility] for left ectopic pregnancy seen on [place] today. She initially presented with abdominal pain and vaginal bleeding.  There are no other associated symptoms.   She has not tried any treatments.   HPI  Past Medical History:  Diagnosis Date  . Capsulitis of right foot 04/20/2015  . Medical history non-contributory    Past Surgical History:  Procedure Laterality Date  . NO PAST SURGERIES     Social History   Socioeconomic History  . Marital status: Single    Spouse name: Not on file  . Number of children: Not on file  . Years of education: Not on file  . Highest education level: Not on file  Occupational History  . Not on file  Social Needs  . Financial resource strain: Not on file  . Food insecurity:    Worry: Not on file    Inability: Not on file  . Transportation needs:    Medical: Not on file    Non-medical: Not on file  Tobacco Use  . Smoking status: Current Every Day Smoker    Packs/day: 0.50    Types: Cigarettes  . Smokeless tobacco: Never Used  Substance and Sexual Activity  . Alcohol use: No    Alcohol/week: 0.0 oz  . Drug use: No  . Sexual activity: Yes    Birth control/protection: None  Lifestyle  . Physical activity:    Days per week: Not on file    Minutes per session: Not on file  . Stress: Not on file  Relationships  . Social connections:    Talks on phone: Not on file    Gets together: Not on file    Attends religious service: Not on file    Active member of club or organization: Not on file    Attends meetings of clubs or organizations: Not on file    Relationship status: Not on file  . Intimate partner violence:    Fear of current or ex partner: Not on file    Emotionally abused: Not on file     Physically abused: Not on file    Forced sexual activity: Not on file  Other Topics Concern  . Not on file  Social History Narrative  . Not on file   No current facility-administered medications on file prior to encounter.    Current Outpatient Medications on File Prior to Encounter  Medication Sig Dispense Refill  . acetaminophen-codeine (TYLENOL #3) 300-30 MG tablet Take 1 tablet by mouth every 4 (four) hours as needed for moderate pain. 60 tablet 0  . ibuprofen (ADVIL,MOTRIN) 600 MG tablet Take 1 tablet (600 mg total) by mouth every 6 (six) hours as needed. 30 tablet 0  . nabumetone (RELAFEN) 500 MG tablet Take 1 tablet (500 mg total) by mouth 2 (two) times daily. 60 tablet 1  . oxyCODONE-acetaminophen (PERCOCET) 7.5-325 MG tablet Take 1 tablet by mouth 2 (two) times daily as needed. 60 tablet 0   No Known Allergies  ROS:  Review of Systems  Constitutional: Negative for chills, fatigue and fever.  Respiratory: Negative for shortness of breath.   Cardiovascular: Negative for chest pain.  Gastrointestinal: Positive for abdominal pain. Negative for nausea and vomiting.  Genitourinary: Positive for pelvic pain  and vaginal bleeding. Negative for difficulty urinating, dysuria, flank pain, vaginal discharge and vaginal pain.  Neurological: Negative for dizziness and headaches.  Psychiatric/Behavioral: Negative.      I have reviewed patient's Past Medical Hx, Surgical Hx, Family Hx, Social Hx, medications and allergies.   Physical Exam   Patient Vitals for the past 24 hrs:  BP Temp Temp src Pulse Resp SpO2 Height Weight  06/12/18 1941 (!) 96/41 98 F (36.7 C) - 62 - - - -  06/12/18 1857 104/68 - - 78 18 100 % - -  06/12/18 1638 105/68 - - 72 18 100 % - -  06/12/18 1402 118/87 98.3 F (36.8 C) Oral (!) 108 18 98 % 5\' 5"  (1.651 m) 160 lb (72.6 kg)   Constitutional: Well-developed, well-nourished female in no acute distress.  Cardiovascular: normal rate Respiratory: normal  effort GI: Abd soft, non-tender. Pos BS x 4 MS: Extremities nontender, no edema, normal ROM Neurologic: Alert and oriented x 4.  GU: Neg CVAT.   LAB RESULTS Results for orders placed or performed during the hospital encounter of 06/12/18 (from the past 24 hour(s))  Basic metabolic panel     Status: Abnormal   Collection Time: 06/12/18  2:40 PM  Result Value Ref Range   Sodium 134 (L) 135 - 145 mmol/L   Potassium 3.7 3.5 - 5.1 mmol/L   Chloride 100 98 - 111 mmol/L   CO2 24 22 - 32 mmol/L   Glucose, Bld 101 (H) 70 - 99 mg/dL   BUN 11 6 - 20 mg/dL   Creatinine, Ser 1.61 0.44 - 1.00 mg/dL   Calcium 9.0 8.9 - 09.6 mg/dL   GFR calc non Af Amer >60 >60 mL/min   GFR calc Af Amer >60 >60 mL/min   Anion gap 10 5 - 15  CBC     Status: None   Collection Time: 06/12/18  2:40 PM  Result Value Ref Range   WBC 6.8 4.0 - 10.5 K/uL   RBC 4.20 3.87 - 5.11 MIL/uL   Hemoglobin 13.3 12.0 - 15.0 g/dL   HCT 04.5 40.9 - 81.1 %   MCV 90.2 78.0 - 100.0 fL   MCH 31.7 26.0 - 34.0 pg   MCHC 35.1 30.0 - 36.0 g/dL   RDW 91.4 78.2 - 95.6 %   Platelets 220 150 - 400 K/uL  hCG, quantitative, pregnancy     Status: Abnormal   Collection Time: 06/12/18  2:40 PM  Result Value Ref Range   hCG, Beta Chain, Quant, S 4,527 (H) <5 mIU/mL  Urinalysis, Routine w reflex microscopic     Status: Abnormal   Collection Time: 06/12/18  2:40 PM  Result Value Ref Range   Color, Urine YELLOW YELLOW   APPearance CLEAR CLEAR   Specific Gravity, Urine 1.015 1.005 - 1.030   pH 6.0 5.0 - 8.0   Glucose, UA NEGATIVE NEGATIVE mg/dL   Hgb urine dipstick MODERATE (A) NEGATIVE   Bilirubin Urine NEGATIVE NEGATIVE   Ketones, ur NEGATIVE NEGATIVE mg/dL   Protein, ur NEGATIVE NEGATIVE mg/dL   Nitrite NEGATIVE NEGATIVE   Leukocytes, UA NEGATIVE NEGATIVE  Wet prep, genital     Status: Abnormal   Collection Time: 06/12/18  2:40 PM  Result Value Ref Range   Yeast Wet Prep HPF POC NONE SEEN NONE SEEN   Trich, Wet Prep NONE SEEN NONE  SEEN   Clue Cells Wet Prep HPF POC PRESENT (A) NONE SEEN   WBC, Wet Prep HPF POC  MODERATE (A) NONE SEEN   Sperm NONE SEEN   Urinalysis, Microscopic (reflex)     Status: Abnormal   Collection Time: 06/12/18  2:40 PM  Result Value Ref Range   RBC / HPF 0-5 0 - 5 RBC/hpf   WBC, UA 0-5 0 - 5 WBC/hpf   Bacteria, UA RARE (A) NONE SEEN   Squamous Epithelial / LPF 6-10 0 - 5  Hepatic function panel     Status: None   Collection Time: 06/12/18  2:48 PM  Result Value Ref Range   Total Protein 7.9 6.5 - 8.1 g/dL   Albumin 4.3 3.5 - 5.0 g/dL   AST 18 15 - 41 U/L   ALT 12 0 - 44 U/L   Alkaline Phosphatase 58 38 - 126 U/L   Total Bilirubin 0.4 0.3 - 1.2 mg/dL   Bilirubin, Direct 0.1 0.0 - 0.2 mg/dL   Indirect Bilirubin 0.3 0.3 - 0.9 mg/dL       IMAGING US Ob Comp < 14 Wks  Result Date: 06/12/2018 CLINICAL DATA:  Pelvic pain, cramping, and vaginal bleeding for 3 days. Gestational age by LMP of 7 weeks 2 days. EXAM: OBSTETRIC <14 WK [place] AND TRANSVAGINAL OB US TECHNIQUE: Both transabdominal and transvaginal ultrasound examinations were performed for complete evaluation of the gestation as well as the maternal uterus, adnexal regions, and pelvic cul-de-sac. Transvaginal technique was performed to assess early pregnancy. COMPARISON:  None. FINDINGS: Intrauterine gestational sac: None Maternal uterus/adnexae: A gestational sac is seen in the left adnexa adjacent to the ovary which contains a yolk sac and embryo. Embryonic crown-rump length measures 5 mm, corresponding to gestational age of [redacted] weeks 1 day. No embryonic cardiac activity is visualized. Overall measurements of the gestation are 1.9 x 1.3 x 1.8 cm. No evidence of hemoperitoneum. IMPRESSION: Ectopic pregnancy in left adnexa, as described above. No evidence of hemoperitoneum. Critical Value/emergent results were called by telephone at the time of interpretation on 06/12/2018 at 4:44 pm to Dr. Judd Lien in the ED, who verbally acknowledged these results.  Electronically Signed   By: Myles Rosenthal M.D.   On: 06/12/2018 16:46   US Ob Transvaginal  Result Date: 06/12/2018 CLINICAL DATA:  Pelvic pain, cramping, and vaginal bleeding for 3 days. Gestational age by LMP of 7 weeks 2 days. EXAM: OBSTETRIC <14 WK [place] AND TRANSVAGINAL OB US TECHNIQUE: Both transabdominal and transvaginal ultrasound examinations were performed for complete evaluation of the gestation as well as the maternal uterus, adnexal regions, and pelvic cul-de-sac. Transvaginal technique was performed to assess early pregnancy. COMPARISON:  None. FINDINGS: Intrauterine gestational sac: None Maternal uterus/adnexae: A gestational sac is seen in the left adnexa adjacent to the ovary which contains a yolk sac and embryo. Embryonic crown-rump length measures 5 mm, corresponding to gestational age of [redacted] weeks 1 day. No embryonic cardiac activity is visualized. Overall measurements of the gestation are 1.9 x 1.3 x 1.8 cm. No evidence of hemoperitoneum. IMPRESSION: Ectopic pregnancy in left adnexa, as described above. No evidence of hemoperitoneum. Critical Value/emergent results were called by telephone at the time of interpretation on 06/12/2018 at 4:44 pm to Dr. Judd Lien in the ED, who verbally acknowledged these results. Electronically Signed   By: Myles Rosenthal M.D.   On: 06/12/2018 16:46    MAU Management/MDM: Methotrexate admin in MAU, Percocet 5/325 x 2 tabs given in MAU. ABO/Rh drawn.   ASSESSMENT 1. Abdominal pregnancy without intrauterine pregnancy     PLAN Report to Wynelle Bourgeois, CNM  Sharen CounterLisa Leftwich-Kirby Certified Nurse-Midwife 06/12/2018  8:43 PM  Tolerated methotrexate injection well Plan to check HCG level on Day 4 (Sunday) then on Day 7 (Wednesday) Ectopic precautons Encouraged to return here or to other Urgent Care/ED if she develops worsening of symptoms, increase in pain, fever, or other concerning symptoms.    Aviva SignsWilliams, Jayshawn Colston L, CNM

## 2018-06-12 NOTE — ED Provider Notes (Addendum)
[facility] EMERGENCY DEPARTMENT Provider Note   CSN: [id number] Arrival date & time: 06/12/18  1358     History   Chief Complaint Chief Complaint  Patient presents with  . Vaginal Bleeding    [redacted] weeks pregnant    HPI Holly Donaldson is a 37 y.o. female who presents the emergency department with chief complaint of vaginal bleeding.  She is G3 P2-0-0-2.  Patient states that she noticed some spotting on Sunday, 14 July.  She states that she did not think much about it because she had some spotting with her last pregnancy about this time.  Yesterday she had some suprapubic pain and then last night about 5 PM she began having heavy vaginal bleeding which she describes as bright red blood without clots or tissue.  She has had to change 3 menstrual pads.  She denies nausea, vomiting, or urinary symptoms.  HPI  Past Medical History:  Diagnosis Date  . Capsulitis of right foot 04/20/2015    Patient Active Problem List   Diagnosis Date Noted  . Capsulitis of right foot 04/20/2015  . Pain in lower limb 04/20/2015  . Edema of right foot 04/20/2015    History reviewed. No pertinent surgical history.   OB History    Gravida  3   Para  2   Term  2   Preterm  0   AB  0   Living  2     SAB  0   TAB  0   Ectopic  0   Multiple  0   Live Births  2            Home Medications    Prior to Admission medications   Medication Sig Start Date End Date Taking? Authorizing Provider  acetaminophen-codeine (TYLENOL #3) 300-30 MG tablet Take 1 tablet by mouth every 4 (four) hours as needed for moderate pain. 07/19/16   Sheard, Myeong O, DPM  ibuprofen (ADVIL,MOTRIN) 600 MG tablet Take 1 tablet (600 mg total) by mouth every 6 (six) hours as needed. 02/05/17   Law, Waylan Boga, PA-C  nabumetone (RELAFEN) 500 MG tablet Take 1 tablet (500 mg total) by mouth 2 (two) times daily. 04/11/17   Sheard, Myeong O, DPM  oxyCODONE-acetaminophen (PERCOCET) 7.5-325 MG tablet Take 1  tablet by mouth 2 (two) times daily as needed. 06/10/17   Sheard, Joline Maxcy, DPM    Family History No family history on file.  Social History Social History   Tobacco Use  . Smoking status: Current Every Day Smoker    Packs/day: 0.50    Types: Cigarettes  . Smokeless tobacco: Never Used  Substance Use Topics  . Alcohol use: No    Alcohol/week: 0.0 oz  . Drug use: No     Allergies   Patient has no known allergies.   Review of Systems Review of Systems  Ten systems reviewed and are negative for acute change, except as noted in the HPI.   Physical Exam Updated Vital Signs BP 105/68 (BP Location: Left Arm)   Pulse 72   Temp 98.3 F (36.8 C) (Oral)   Resp 18   Ht 5\' 5"  (1.651 m)   Wt 72.6 kg (160 lb)   LMP 04/22/2018   SpO2 100%   BMI 26.63 kg/m   Physical Exam  Constitutional: She is oriented to person, place, and time. She appears well-developed and well-nourished. No distress.  HENT:  Head: Normocephalic and atraumatic.  Eyes: Pupils are equal, round, and  reactive to light. Conjunctivae and EOM are normal. No scleral icterus.  Neck: Normal range of motion.  Cardiovascular: Normal rate, regular rhythm and normal heart sounds. Exam reveals no gallop and no friction rub.  No murmur heard. Pulmonary/Chest: Effort normal and breath sounds normal. No respiratory distress.  Abdominal: Soft. Bowel sounds are normal. She exhibits no distension and no mass. There is no tenderness. There is no guarding.  Genitourinary:  Genitourinary Comments: Pelvic exam: VULVA: normal appearing vulva with no masses, tenderness or lesions, VAGINA: normal appearing vagina with normal color and discharge, no lesions, CERVIX: normal  Cervix with bloody discharge. No CMT  Neurological: She is alert and oriented to person, place, and time.  Skin: Skin is warm and dry. She is not diaphoretic.  Psychiatric: Her behavior is normal.  Nursing note and vitals reviewed.    ED Treatments /  Results  Labs (all labs ordered are listed, but only abnormal results are displayed) Labs Reviewed  WET PREP, GENITAL - Abnormal; Notable for the following components:      Result Value   Clue Cells Wet Prep HPF POC PRESENT (*)    WBC, Wet Prep HPF POC MODERATE (*)    All other components within normal limits  BASIC METABOLIC PANEL - Abnormal; Notable for the following components:   Sodium 134 (*)    Glucose, Bld 101 (*)    All other components within normal limits  HCG, QUANTITATIVE, PREGNANCY - Abnormal; Notable for the following components:   hCG, Beta Chain, Quant, S 4,527 (*)    All other components within normal limits  URINALYSIS, ROUTINE W REFLEX MICROSCOPIC - Abnormal; Notable for the following components:   Hgb urine dipstick MODERATE (*)    All other components within normal limits  URINALYSIS, MICROSCOPIC (REFLEX) - Abnormal; Notable for the following components:   Bacteria, UA RARE (*)    All other components within normal limits  CBC  RPR  HIV ANTIBODY (ROUTINE TESTING)  HEPATIC FUNCTION PANEL  GC/CHLAMYDIA PROBE AMP ([place]    EKG None  Radiology [place] Ob Comp < 14 Wks  Result Date: 06/12/2018 CLINICAL DATA:  Pelvic pain, cramping, and vaginal bleeding for 3 days. Gestational age by LMP of 7 weeks 2 days. EXAM: OBSTETRIC <14 WK US AND TRANSVAGINAL OB US TECHNIQUE: Both transabdominal and transvaginal ultrasound examinations were performed for complete evaluation of the gestation as well as the maternal uterus, adnexal regions, and pelvic cul-de-sac. Transvaginal technique was performed to assess early pregnancy. COMPARISON:  None. FINDINGS: Intrauterine gestational sac: None Maternal uterus/adnexae: A gestational sac is seen in the left adnexa adjacent to the ovary which contains a yolk sac and embryo. Embryonic crown-rump length measures 5 mm, corresponding to gestational age of [redacted] weeks 1 day. No embryonic cardiac activity is visualized. Overall  measurements of the gestation are 1.9 x 1.3 x 1.8 cm. No evidence of hemoperitoneum. IMPRESSION: Ectopic pregnancy in left adnexa, as described above. No evidence of hemoperitoneum. Critical Value/emergent results were called by telephone at the time of interpretation on 06/12/2018 at 4:44 pm to Dr. Judd Lienelo in the ED, who verbally acknowledged these results. Electronically Signed   By: Myles RosenthalJohn  Stahl M.D.   On: 06/12/2018 16:46   [place] Ob Transvaginal  Result Date: 06/12/2018 CLINICAL DATA:  Pelvic pain, cramping, and vaginal bleeding for 3 days. Gestational age by LMP of 7 weeks 2 days. EXAM: OBSTETRIC <14 WK US AND TRANSVAGINAL OB US TECHNIQUE: Both transabdominal and transvaginal ultrasound  examinations were performed for complete evaluation of the gestation as well as the maternal uterus, adnexal regions, and pelvic cul-de-sac. Transvaginal technique was performed to assess early pregnancy. COMPARISON:  None. FINDINGS: Intrauterine gestational sac: None Maternal uterus/adnexae: A gestational sac is seen in the left adnexa adjacent to the ovary which contains a yolk sac and embryo. Embryonic crown-rump length measures 5 mm, corresponding to gestational age of [redacted] weeks 1 day. No embryonic cardiac activity is visualized. Overall measurements of the gestation are 1.9 x 1.3 x 1.8 cm. No evidence of hemoperitoneum. IMPRESSION: Ectopic pregnancy in left adnexa, as described above. No evidence of hemoperitoneum. Critical Value/emergent results were called by telephone at the time of interpretation on 06/12/2018 at 4:44 pm to Dr. Judd Lien in the ED, who verbally acknowledged these results. Electronically Signed   By: Myles Rosenthal M.D.   On: 06/12/2018 16:46    Procedures .Critical Care Performed by: Arthor Captain, PA-C Authorized by: Arthor Captain, PA-C   Critical care provider statement:    Critical care time (minutes):  50   Critical care was necessary to treat or prevent imminent or life-threatening deterioration of  the following conditions: ectopic pregnancy.   Critical care was time spent personally by me on the following activities:  Obtaining history from patient or surrogate, blood draw for specimens, development of treatment plan with patient or surrogate, discussions with consultants, evaluation of patient's response to treatment, examination of patient, ordering and performing treatments and interventions, ordering and review of laboratory studies, ordering and review of radiographic studies, re-evaluation of patient's condition, review of old charts and pulse oximetry   (including critical care time)  Medications Ordered in ED Medications - No data to display   Initial Impression / Assessment and Plan / ED Course  I have reviewed the triage vital signs and the nursing notes.  Pertinent labs & imaging results that were available during my care of the patient were reviewed by me and considered in my medical decision making (see chart for details).  Clinical Course as of Jun 12 1758  Thu Jun 12, 2018  1757 Patient with ectopic pregnancy in the adnexa.  Embryo does not have a heartbeat.  I spoke with Dr. Erin Fulling who recommends treatment with methotrexate.  The patient will be transferred to the maternal admissions unit for treatment and I have discussed all findings with the patient.   [AH]    Clinical Course User Index [AH] Arthor Captain, PA-C      Final Clinical Impressions(s) / ED Diagnoses   Final diagnoses:  Abdominal pregnancy without intrauterine pregnancy    ED Discharge Orders    None       Arthor Captain, PA-C 06/12/18 1759    Geoffery Lyons, MD 06/12/18 1806    Arthor Captain, PA-C 06/12/18 Kristopher Oppenheim    Geoffery Lyons, MD 06/13/18 2348

## 2018-06-12 NOTE — Discharge Instructions (Signed)
Ectopic Pregnancy °An ectopic pregnancy is when the fertilized egg attaches (implants) outside the uterus. Most ectopic pregnancies occur in one of the tubes where eggs travel from the ovary to the uterus (fallopian tubes), but the implanting can occur in other locations. In rare cases, ectopic pregnancies occur on the ovary, intestine, pelvis, abdomen, or cervix. In an ectopic pregnancy, the fertilized egg does not have the ability to develop into a normal, healthy baby. °A ruptured ectopic pregnancy is one in which tearing or bursting of a fallopian tube causes internal bleeding. Often, there is intense lower abdominal pain, and vaginal bleeding sometimes occurs. Having an ectopic pregnancy can be life-threatening. If this dangerous condition is not treated, it can lead to blood loss, shock, or even death. °What are the causes? °The most common cause of this condition is damage to one of the fallopian tubes. A fallopian tube may be narrowed or blocked, and that keeps the fertilized egg from reaching the uterus. °What increases the risk? °This condition is more likely to develop in women of childbearing age who have different levels of risk. The levels of risk can be divided into three categories. °High risk °· You have gone through infertility treatment. °· You have had an ectopic pregnancy before. °· You have had surgery on the fallopian tubes, or another surgical procedure, such as an abortion. °· You have had surgery to have the fallopian tubes tied (tubal ligation). °· You have problems or diseases of the fallopian tubes. °· You have been exposed to diethylstilbestrol (DES). This medicine was used until 1971, and it had effects on babies whose mothers took the medicine. °· You become pregnant while using an IUD (intrauterine device) for birth control. °Moderate risk °· You have a history of infertility. °· You have had an STI (sexually transmitted infection). °· You have a history of pelvic inflammatory  disease (PID). °· You have scarring from endometriosis. °· You have multiple sexual partners. °· You smoke. °Low risk °· You have had pelvic surgery. °· You use vaginal douches. °· You became sexually active before age 18. °What are the signs or symptoms? °Common symptoms of this condition include normal pregnancy symptoms, such as missing a period, nausea, tiredness, abdominal pain, breast tenderness, and bleeding. However, ectopic pregnancy will have additional symptoms, such as: °· Pain with intercourse. °· Irregular vaginal bleeding or spotting. °· Cramping or pain on one side or in the lower abdomen. °· Fast heartbeat, low blood pressure, and sweating. °· Passing out while having a bowel movement. ° °Symptoms of a ruptured ectopic pregnancy and internal bleeding may include: °· Sudden, severe pain in the abdomen and pelvis. °· Dizziness, weakness, light-headedness, or fainting. °· Pain in the shoulder or neck area. ° °How is this diagnosed? °This condition is diagnosed by: °· A pelvic exam to locate pain or a mass in the abdomen. °· A pregnancy test. This blood test checks for the presence as well as the specific level of pregnancy hormone in the bloodstream. °· Ultrasound. This is performed if a pregnancy test is positive. In this test, a probe is inserted into the vagina. The probe will detect a fetus, possibly in a location other than the uterus. °· Taking a sample of uterus tissue (dilation and curettage, or D&C). °· Surgery to perform a visual exam of the inside of the abdomen using a thin, lighted tube that has a tiny camera on the end (laparoscope). °· Culdocentesis. This procedure involves inserting a needle at the top   of the vagina, behind the uterus. If blood is present in this area, it may indicate that a fallopian tube is torn. ° °How is this treated? °This condition is treated with medicine or surgery. °Medicine °· An injection of a medicine (methotrexate) may be given to cause the pregnancy tissue  to be absorbed. This medicine may save your fallopian tube. It may be given if: °? The diagnosis is made early, with no signs of active bleeding. °? The fallopian tube has not ruptured. °? You are considered to be a good candidate for the medicine. °Usually, pregnancy hormone blood levels are checked after methotrexate treatment. This is to be sure that the medicine is effective. It may take 4-6 weeks for the pregnancy to be absorbed. Most pregnancies will be absorbed by 3 weeks. °Surgery °· A laparoscope may be used to remove the pregnancy tissue. °· If severe internal bleeding occurs, a larger cut (incision) may be made in the lower abdomen (laparotomy) to remove the fetus and placenta. This is done to stop the bleeding. °· Part or all of the fallopian tube may be removed (salpingectomy) along with the fetus and placenta. The fallopian tube may also be repaired during the surgery. °· In very rare circumstances, removal of the uterus (hysterectomy) may be required. °· After surgery, pregnancy hormone testing may be done to be sure that there is no pregnancy tissue left. °Whether your treatment is medicine or surgery, you may receive a Rho (D) immune globulin shot to prevent problems with any future pregnancy. This shot may be given if: °· You are Rh-negative and the baby's father is Rh-positive. °· You are Rh-negative and you do not know the Rh type of the baby's father. ° °Follow these instructions at home: °· Rest and limit your activity after the procedure for as long as told by your health care provider. °· Until your health care provider says that it is safe: °? Do not lift anything that is heavier than 10 lb (4.5 kg), or the limit that your health care provider tells you. °? Avoid physical exercise and any movement that requires effort (is strenuous). °· To help prevent constipation: °? Eat a healthy diet that includes fruits, vegetables, and whole grains. °? Drink 6-8 glasses of water per day. °Get help  right away if: °· You develop worsening pain that is not relieved by medicine. °· You have: °? A fever or chills. °? Vaginal bleeding. °? Redness and swelling at the incision site. °? Nausea and vomiting. °· You feel dizzy or weak. °· You feel light-headed or you faint. °This information is not intended to replace advice given to you by your health care provider. Make sure you discuss any questions you have with your health care provider. °Document Released: 12/20/2004 Document Revised: 07/11/2016 Document Reviewed: 06/13/2016 °Elsevier Interactive Patient Education © 2018 [employer] ° ° °Methotrexate Treatment for an Ectopic Pregnancy °Methotrexate is a medicine that treats a pregnancy condition in which the fetus develops outside the uterus (ectopic pregnancy) by stopping the growth of the fertilized egg. It also helps your body absorb tissue from the egg. This takes between 2 weeks and 6 weeks. Most ectopic pregnancies can be successfully treated with methotrexate if they are detected early enough. °Tell a health care provider about: °· Any allergies you have. °· All medicines you are taking, including vitamins, herbs, eye drops, creams, and over-the-counter medicines. °· Any medical conditions you have. °What are the risks? °Generally, this is a safe treatment.   However, problems can occur, including: °· Nausea. °· Vomiting. °· Diarrhea. °· Abdominal cramping. °· Mouth sores. °· Increased vaginal bleeding or spotting. °· Swelling or irritation of the lining of your lungs (pneumonitis). °· Failed treatment and continuation of the pregnancy. °· Liver damage. °· Hair loss. ° °There is still a risk of the ectopic pregnancy rupturing while using the methotrexate. °What happens before the procedure? °Before you take the medicine: °· Liver tests, kidney tests, and a complete blood test are performed. °· Blood tests are performed to measure the pregnancy hormone levels and to determine your blood type. °· If you are  Rh-negative and the father is Rh-positive or his Rh type is not known, you will be given a Rho (D) immune globulin shot. ° °What happens during the procedure? °There are two methods that your health care provider may use to give you methotrexate. °· One method involves a single dose or injection of the medicine. °· Another method involves a series of doses given through several injections. ° °What happens after the procedure? °· You may have some abdominal cramping, vaginal bleeding, and fatigue in the first few days after taking methotrexate. °· Blood tests will be taken for several weeks to check the pregnancy hormone levels. The blood tests are performed until there is no more pregnancy hormone detected in the blood. °This information is not intended to replace advice given to you by your health care provider. Make sure you discuss any questions you have with your health care provider. °Document Released: 11/06/2001 Document Revised: 04/19/2016 Document Reviewed: 08/31/2013 °Elsevier Interactive Patient Education © 2017 [employer] ° °

## 2018-06-13 LAB — RPR: RPR: NONREACTIVE

## 2018-06-13 LAB — HIV ANTIBODY (ROUTINE TESTING W REFLEX): HIV SCREEN 4TH GENERATION: NONREACTIVE

## 2018-06-13 LAB — GC/CHLAMYDIA PROBE AMP (~~LOC~~) NOT AT ARMC
Chlamydia: NEGATIVE
NEISSERIA GONORRHEA: NEGATIVE

## 2018-06-13 LAB — ABO/RH: ABO/RH(D): A POS

## 2018-06-15 ENCOUNTER — Telehealth: Payer: Self-pay | Admitting: Advanced Practice Midwife

## 2018-06-15 ENCOUNTER — Other Ambulatory Visit: Payer: Self-pay

## 2018-06-15 ENCOUNTER — Inpatient Hospital Stay (HOSPITAL_COMMUNITY)
Admission: AD | Admit: 2018-06-15 | Discharge: 2018-06-15 | Disposition: A | Discharge status: Home / Self Care | Payer: Self-pay | Source: Ambulatory Visit | Attending: Obstetrics and Gynecology | Admitting: Obstetrics and Gynecology

## 2018-06-15 DIAGNOSIS — Z79899 Other long term (current) drug therapy: Secondary | ICD-10-CM

## 2018-06-15 DIAGNOSIS — F17210 Nicotine dependence, cigarettes, uncomplicated: Secondary | ICD-10-CM | POA: Insufficient documentation

## 2018-06-15 DIAGNOSIS — O009 Unspecified ectopic pregnancy without intrauterine pregnancy: Secondary | ICD-10-CM | POA: Insufficient documentation

## 2018-06-15 DIAGNOSIS — Z5181 Encounter for therapeutic drug level monitoring: Secondary | ICD-10-CM

## 2018-06-15 LAB — HCG, QUANTITATIVE, PREGNANCY: hCG, Beta Chain, Quant, S: 5846 m[IU]/mL — ABNORMAL HIGH (ref <5)

## 2018-06-15 NOTE — Discharge Instructions (Signed)
Ectopic Pregnancy °An ectopic pregnancy happens when a fertilized egg grows outside the uterus. A pregnancy cannot live outside of the uterus. This problem often happens in the fallopian tube. It is often caused by damage to the fallopian tube. °If this problem is found early, you may be treated with medicine. If your tube tears or bursts open (ruptures), you will bleed inside. This is an emergency. You will need surgery. Get help right away. °What are the signs or symptoms? °You may have normal pregnancy symptoms at first. These include: °· Missing your period. °· Feeling sick to your stomach (nauseous). °· Being tired. °· Having tender breasts. ° °Then, you may start to have symptoms that are not normal. These include: °· Pain with sex (intercourse). °· Bleeding from the vagina. This includes light bleeding (spotting). °· Belly (abdomen) or lower belly cramping or pain. This may be felt on one side. °· A fast heartbeat (pulse). °· Passing out (fainting) after going poop (bowel movement). ° °If your tube tears, you may have symptoms such as: °· Really bad pain in the belly or lower belly. This happens suddenly. °· Dizziness. °· Passing out. °· Shoulder pain. ° °Get help right away if: °You have any of these symptoms. This is an emergency. °This information is not intended to replace advice given to you by your health care provider. Make sure you discuss any questions you have with your health care provider. °Document Released: 02/08/2009 Document Revised: 04/19/2016 Document Reviewed: 06/24/2013 °Elsevier Interactive Patient Education © 2017 [employer] ° °

## 2018-06-15 NOTE — Telephone Encounter (Signed)
DW Dr. Emelda FearFerguson, he would like for the patient to return for a 2nd dose of MTX as her hcg has risen by >1000 and it is now above 5000. There is increased incidence of MTX failure at HCG levels >5000. Patient called, and left message to please return our call.   Holly ShellerHeather Ivory Donaldson 4:53 PM 06/15/18

## 2018-06-15 NOTE — MAU Note (Signed)
Doing ok.  Having a little bit of mild cramping, more on the left.  Small amt of bleeding, not as heavy now.

## 2018-06-15 NOTE — MAU Note (Signed)
Labs drawn.... Pt not in lobby

## 2018-06-15 NOTE — Telephone Encounter (Signed)
Attempted to call patient again. No answer. Left message for her to call the unit.  Thressa ShellerHeather Hogan 7:10 PM 06/15/18

## 2018-06-15 NOTE — MAU Note (Signed)
Day 4 post MTX for ectopic preg.

## 2018-06-15 NOTE — MAU Provider Note (Signed)
History     CSN: [id number]  Arrival date and time: 06/15/18 1152   None     Chief Complaint  Patient presents with  . Loney Laurence   Holly Donaldson is a 37 y.o. G3P2002 at [redacted]w[redacted]d who presents today for day #4 S/P MTX for ectopic pregnancy. She reports minimal pain that is much better than when she was here 06/12/18. She reports minimal bleeding at this time.   Pelvic Pain  The patient's primary symptoms include pelvic pain and vaginal bleeding. This is a new problem. The current episode started in the past 7 days. Pain severity now: 4/10, much improved from her last visit  She is pregnant. Pertinent negatives include no dysuria, fever, nausea or vomiting. The vaginal bleeding is lighter than menses. She has not been passing clots. She has not been passing tissue.      Past Medical History:  Diagnosis Date  . Capsulitis of right foot 04/20/2015  . Medical history non-contributory     Past Surgical History:  Procedure Laterality Date  . NO PAST SURGERIES      No family history on file.  Social History   Tobacco Use  . Smoking status: Current Every Day Smoker    Packs/day: 0.50    Types: Cigarettes  . Smokeless tobacco: Never Used  Substance Use Topics  . Alcohol use: No    Alcohol/week: 0.0 oz  . Drug use: No    Allergies: No Known Allergies  Medications Prior to Admission  Medication Sig Dispense Refill Last Dose  . acetaminophen-codeine (TYLENOL #3) 300-30 MG tablet Take 1 tablet by mouth every 4 (four) hours as needed for moderate pain. 60 tablet 0 Taking  . ibuprofen (ADVIL,MOTRIN) 600 MG tablet Take 1 tablet (600 mg total) by mouth every 6 (six) hours as needed. 30 tablet 0   . nabumetone (RELAFEN) 500 MG tablet Take 1 tablet (500 mg total) by mouth 2 (two) times daily. 60 tablet 1   . oxyCODONE-acetaminophen (PERCOCET/ROXICET) 5-325 MG tablet Take 1-2 tablets by mouth every 6 (six) hours as needed. 20 tablet 0   . promethazine (PHENERGAN) 25 MG tablet Take 1 tablet  (25 mg total) by mouth every 6 (six) hours as needed for nausea or vomiting. 30 tablet 2     Review of Systems  Constitutional: Negative for fever.  Gastrointestinal: Negative for nausea and vomiting.  Genitourinary: Positive for pelvic pain. Negative for dysuria.   Physical Exam   Blood pressure 116/65, pulse 63, temperature 98.3 F (36.8 C), resp. rate 16, last menstrual period 04/22/2018, SpO2 100 %.  Physical Exam  Nursing note and vitals reviewed. Constitutional: She is oriented to person, place, and time. She appears well-developed and well-nourished. No distress.  HENT:  Head: Normocephalic.  Cardiovascular: Normal rate.  Respiratory: Effort normal.  GI: Soft. There is no tenderness.  Neurological: She is alert and oriented to person, place, and time.  Skin: Skin is warm and dry.  Psychiatric: She has a normal mood and affect.   Results for ANAMIKA, KUEKER (MRN [id number]) as of 06/15/2018 13:45  Ref. Range 06/12/2018 14:40 06/12/2018 14:48 06/12/2018 16:32 06/12/2018 20:24 06/15/2018 12:31  HCG, Beta Chain, Quant, S Latest Ref Range: <5 mIU/mL 4,527 (H)    5,846 (H)   MAU Course  Procedures  MDM   Assessment and Plan   1. Ectopic pregnancy, unspecified location, unspecified whether intrauterine pregnancy present   2. Encounter for monitoring of methotrexate therapy    DC home Comfort measures reviewed  Bleeding precautions Ectopic precautions RX: no new medications  Return to MAU as needed   Follow-up Information    [facility].   Specialty:  Obstetrics and Gynecology Why:  Wednesday 7/24 at 10:35  Contact information: [address] [place] [id number] [phone]           Holly ShellerHeather Maelani Donaldson 06/15/2018, 1:45 PM

## 2018-06-18 ENCOUNTER — Ambulatory Visit (HOSPITAL_COMMUNITY)
Admission: RE | Admit: 2018-06-18 | Discharge: 2018-06-18 | Disposition: A | Discharge status: Home / Self Care | Payer: Self-pay | Source: Ambulatory Visit | Attending: Student | Admitting: Student

## 2018-06-18 ENCOUNTER — Inpatient Hospital Stay (HOSPITAL_COMMUNITY)
Admission: AD | Admit: 2018-06-18 | Discharge: 2018-06-18 | Disposition: A | Discharge status: Home / Self Care | Payer: Self-pay | Source: Ambulatory Visit | Attending: Obstetrics and Gynecology | Admitting: Obstetrics and Gynecology

## 2018-06-18 ENCOUNTER — Ambulatory Visit (INDEPENDENT_AMBULATORY_CARE_PROVIDER_SITE_OTHER): Payer: Self-pay | Admitting: General Practice

## 2018-06-18 ENCOUNTER — Other Ambulatory Visit: Payer: Self-pay

## 2018-06-18 ENCOUNTER — Encounter: Payer: Self-pay | Admitting: Obstetrics and Gynecology

## 2018-06-18 DIAGNOSIS — O009 Unspecified ectopic pregnancy without intrauterine pregnancy: Secondary | ICD-10-CM | POA: Insufficient documentation

## 2018-06-18 DIAGNOSIS — O00109 Unspecified tubal pregnancy without intrauterine pregnancy: Secondary | ICD-10-CM

## 2018-06-18 DIAGNOSIS — R188 Other ascites: Secondary | ICD-10-CM | POA: Insufficient documentation

## 2018-06-18 LAB — HCG, QUANTITATIVE, PREGNANCY: hCG, Beta Chain, Quant, S: 5395 m[IU]/mL — ABNORMAL HIGH (ref <5)

## 2018-06-18 MED ORDER — METHOTREXATE INJECTION FOR WOMEN'S HOSPITAL
50.0000 mg/m2 | Freq: Once | INTRAMUSCULAR | Status: AC
  Administered 2018-06-18: 95 mg via INTRAMUSCULAR
  Filled 2018-06-18: qty 1.9

## 2018-06-18 NOTE — Progress Notes (Signed)
Patient presents to office today for stat bhcg day #7 labs following MTX. Patient reports spotting but denies pain. Discussed with patient we are monitoring her bhcg levels today & asked she wait in lobby for results/updated plan of care. Patient verbalized understanding & had no questions at this time.  Reviewed results with Judeth HornErin Lawrence who finds small decrease in bhcg levels but not sufficient decrease. Patient should have follow up ultrasound today. U/s scheduled to be done stat. Patient informed of results & taken upstairs to ultrasound- will return for results.  Reviewed ultrasound results with Judeth HornErin Lawrence who finds ultrasound showing no decrease in size- patient will need to go to MAU for 2nd dose of MTX.  Patient informed of results & escorted upstairs to MAU.

## 2018-06-18 NOTE — MAU Note (Signed)
Pt sent from MD office for 2nd dose of methtrexate.

## 2018-06-18 NOTE — Progress Notes (Signed)
Chart reviewed for nurse visit. Agree with plan of care.   Discussed patient's labs & ultrasound with Dr. Vergie LivingPickens. Will give 2nd dose of methotrexate. Pt to return to MAU on Sunday for HCG.   Judeth HornLawrence, Karuna Balducci, NP 06/18/2018 5:18 PM

## 2018-06-21 ENCOUNTER — Inpatient Hospital Stay (HOSPITAL_COMMUNITY)
Admission: AD | Admit: 2018-06-21 | Discharge: 2018-06-21 | Disposition: A | Discharge status: Home / Self Care | Payer: Self-pay | Source: Ambulatory Visit | Attending: Obstetrics and Gynecology | Admitting: Obstetrics and Gynecology

## 2018-06-21 DIAGNOSIS — O00109 Unspecified tubal pregnancy without intrauterine pregnancy: Secondary | ICD-10-CM | POA: Insufficient documentation

## 2018-06-21 DIAGNOSIS — O00102 Left tubal pregnancy without intrauterine pregnancy: Secondary | ICD-10-CM

## 2018-06-21 LAB — HCG, QUANTITATIVE, PREGNANCY: hCG, Beta Chain, Quant, S: 4690 m[IU]/mL — ABNORMAL HIGH (ref <5)

## 2018-06-21 NOTE — MAU Provider Note (Signed)
S: 37 y.o. [id number] @[redacted]w[redacted]d  by LMP presents to MAU for  repeat hcg.  She is Day#4 following second administration of MTX on 7/24 for left ectopic pregnancy.  She denies abdominal pain or vaginal bleeding today.    Her quant hcg on 7/24 was 5395 and ultrasound showed left ectopic pregnancy unchanged from previous studies, no evidence of IUP or of ruptured ectopic.  HPI  O: BP 114/62 (BP Location: Right Arm)   Pulse 71   Temp 98.2 F (36.8 C) (Oral)   Resp 16   Wt 166 lb (75.3 kg)   LMP 04/22/2018   SpO2 100% Comment: ra  BMI 27.62 kg/m   VS reviewed, nursing note reviewed,  Constitutional: well developed, well nourished, no distress HEENT: normocephalic CV: normal rate Pulm/chest wall: normal effort Abdomen: soft Neuro: alert and oriented x 3 Skin: warm, dry Psych: affect normal  Results for orders placed or performed during the hospital encounter of 06/21/18 (from the past 24 hour(s))  hCG, quantitative, pregnancy     Status: Abnormal   Collection Time: 06/21/18  9:48 AM  Result Value Ref Range   hCG, Beta Chain, Quant, S 4,690 (H) <5 mIU/mL    --/--/A POS Performed at [facility], [address] [place], [place] [id number]  (07/18 2024)  MDM: Ordered labs/reviewed results.  Quant hcg dropped slightly, appropriate for Day 4 labs. Pt stable today.  F/U on Day 7, Tuesday, 06[facility]24/[facility] for stat hcg.  Ectopic precautions given and pt to return to MAU sooner if s/sx of ectopic, as ruptured ectopic can be life threatening.  Pt stable at time of discharge.  A:  1. Left tubal pregnancy without intrauterine pregnancy     P: D/C home with ectopic/bleeding precautions F/U with lab in office on Day 7 Return to MAU as needed for emergencies  LEFTWICH-KIRBY, Aleksey Newbern, CNM 2:18 PM

## 2018-06-21 NOTE — MAU Note (Signed)
Holly Donaldson is a 37 y.o. at 394w4d here in MAU: for follow up BHCG Pain score: denies at this time Denies vaginal bleeding Received 2nd does of methotrexate on 7/24 Vitals:   06/21/18 1035  BP: 114/62  Pulse: 71  Resp: 16  Temp: 98.2 F (36.8 C)  SpO2: 100%     Lab orders placed from triage: BHCG

## 2018-06-21 NOTE — MAU Note (Signed)
0948--labs drawn 1018--patient called to triage and not in lobby

## 2018-06-24 ENCOUNTER — Ambulatory Visit (INDEPENDENT_AMBULATORY_CARE_PROVIDER_SITE_OTHER): Payer: Self-pay | Admitting: General Practice

## 2018-06-24 DIAGNOSIS — O00109 Unspecified tubal pregnancy without intrauterine pregnancy: Secondary | ICD-10-CM

## 2018-06-24 LAB — HCG, QUANTITATIVE, PREGNANCY: hCG, Beta Chain, Quant, S: 3264 m[IU]/mL — ABNORMAL HIGH

## 2018-06-24 NOTE — Progress Notes (Signed)
Patient presents to office today for stat bhcg day #7 labs following 2nd dose of MTX. Patient denies pain or bleeding. Discussed with patient we are monitoring your bhcg results today & asked she wait in lobby for results/updated plan of care. Patient verbalized understanding & had no questions at this time.  Reviewed results with Dr Alysia PennaErvin who finds appropriate decreasing bhcg levels. Patient should have follow up non stat bhcg in 1 week.   Informed patient of results & discussed follow up. Patient verbalized understanding to all & had no questions.

## 2018-06-24 NOTE — Progress Notes (Signed)
Agree with A & P. 

## 2018-07-01 ENCOUNTER — Other Ambulatory Visit: Payer: Self-pay

## 2018-07-01 DIAGNOSIS — O00109 Unspecified tubal pregnancy without intrauterine pregnancy: Secondary | ICD-10-CM

## 2018-07-02 LAB — BETA HCG QUANT (REF LAB): HCG QUANT: 898 m[IU]/mL

## 2018-07-08 ENCOUNTER — Telehealth: Payer: Self-pay

## 2018-07-08 DIAGNOSIS — O00109 Unspecified tubal pregnancy without intrauterine pregnancy: Secondary | ICD-10-CM

## 2018-07-08 NOTE — Telephone Encounter (Signed)
Called patient- no answer left a message to call us back concerning test results.

## 2018-07-08 NOTE — Telephone Encounter (Signed)
Pt called requesting test results. 

## 2018-07-09 ENCOUNTER — Encounter: Payer: Self-pay | Admitting: General Practice

## 2018-07-09 ENCOUNTER — Other Ambulatory Visit: Payer: Self-pay

## 2018-07-09 DIAGNOSIS — O009 Unspecified ectopic pregnancy without intrauterine pregnancy: Secondary | ICD-10-CM

## 2018-07-09 NOTE — Telephone Encounter (Signed)
Called patient, no answer- left message stating we are trying to reach you with results please call us back.

## 2018-07-09 NOTE — Telephone Encounter (Signed)
-----   Message from Hermina StaggersMichael L Ervin, MD sent at 07/09/2018  8:25 AM EDT ----- BHCG dropping as expected. F/U BHCG as per protocol Thanks Casimiro NeedleMichael

## 2018-07-09 NOTE — Telephone Encounter (Signed)
Patient called & left message on nurse voicemail line stating she is returning our phone call. Called & informed her of results & need for follow up. Patient verbalized understanding & states she can come tomorrow at 950am. Patient had no questions.

## 2018-07-10 ENCOUNTER — Other Ambulatory Visit: Payer: Self-pay

## 2018-07-10 DIAGNOSIS — O009 Unspecified ectopic pregnancy without intrauterine pregnancy: Secondary | ICD-10-CM

## 2018-07-11 LAB — BETA HCG QUANT (REF LAB): hCG Quant: 106 m[IU]/mL

## 2018-07-23 ENCOUNTER — Other Ambulatory Visit: Payer: Self-pay

## 2018-07-23 DIAGNOSIS — O009 Unspecified ectopic pregnancy without intrauterine pregnancy: Secondary | ICD-10-CM

## 2018-07-25 ENCOUNTER — Other Ambulatory Visit: Payer: Self-pay

## 2018-07-25 DIAGNOSIS — O009 Unspecified ectopic pregnancy without intrauterine pregnancy: Secondary | ICD-10-CM

## 2018-07-26 LAB — BETA HCG QUANT (REF LAB): hCG Quant: <1 m[IU]/mL

## 2018-07-29 ENCOUNTER — Telehealth: Payer: Self-pay

## 2018-07-29 NOTE — Telephone Encounter (Addendum)
-----   Message from Hermina Staggers, MD sent at 07/29/2018 10:34 AM EDT ----- Please let pt know that ectopic pregnancy has completely resolved. No further need for blood work. Needs to make decision on contraception. Thanks Casimiro Needle  Notified pt providers recommendation and to schedule an appt for birth control.  Pt stated understanding with no further questions.

## 2018-12-03 ENCOUNTER — Encounter (HOSPITAL_BASED_OUTPATIENT_CLINIC_OR_DEPARTMENT_OTHER): Payer: Self-pay

## 2018-12-03 ENCOUNTER — Emergency Department (HOSPITAL_BASED_OUTPATIENT_CLINIC_OR_DEPARTMENT_OTHER)
Admission: EM | Admit: 2018-12-03 | Discharge: 2018-12-03 | Disposition: A | Discharge status: Home / Self Care | Payer: Self-pay | Attending: Emergency Medicine | Admitting: Emergency Medicine

## 2018-12-03 ENCOUNTER — Other Ambulatory Visit: Payer: Self-pay

## 2018-12-03 DIAGNOSIS — R638 Other symptoms and signs concerning food and fluid intake: Secondary | ICD-10-CM | POA: Insufficient documentation

## 2018-12-03 DIAGNOSIS — F17210 Nicotine dependence, cigarettes, uncomplicated: Secondary | ICD-10-CM | POA: Insufficient documentation

## 2018-12-03 DIAGNOSIS — R112 Nausea with vomiting, unspecified: Secondary | ICD-10-CM | POA: Insufficient documentation

## 2018-12-03 LAB — URINALYSIS, ROUTINE W REFLEX MICROSCOPIC
Glucose, UA: NEGATIVE mg/dL
KETONES UR: NEGATIVE mg/dL
Nitrite: NEGATIVE
PH: 6.5 (ref 5.0–8.0)
Protein, ur: 30 mg/dL — AB
SPECIFIC GRAVITY, URINE: 1.025 (ref 1.005–1.030)

## 2018-12-03 LAB — CBC WITH DIFFERENTIAL/PLATELET
ABS IMMATURE GRANULOCYTES: 0.01 10*3/uL (ref 0.00–0.07)
BASOS PCT: 1 %
Basophils Absolute: 0 10*3/uL (ref 0.0–0.1)
EOS PCT: 1 %
Eosinophils Absolute: 0.1 10*3/uL (ref 0.0–0.5)
HCT: 38.9 % (ref 36.0–46.0)
Hemoglobin: 13 g/dL (ref 12.0–15.0)
Immature Granulocytes: 0 %
Lymphocytes Relative: 51 %
Lymphs Abs: 2.9 10*3/uL (ref 0.7–4.0)
MCH: 30.8 pg (ref 26.0–34.0)
MCHC: 33.4 g/dL (ref 30.0–36.0)
MCV: 92.2 fL (ref 80.0–100.0)
MONO ABS: 0.3 10*3/uL (ref 0.1–1.0)
MONOS PCT: 5 %
Neutro Abs: 2.4 10*3/uL (ref 1.7–7.7)
Neutrophils Relative %: 42 %
PLATELETS: 205 10*3/uL (ref 150–400)
RBC: 4.22 MIL/uL (ref 3.87–5.11)
RDW: 12.1 % (ref 11.5–15.5)
WBC: 5.7 10*3/uL (ref 4.0–10.5)
nRBC: 0 % (ref 0.0–0.2)

## 2018-12-03 LAB — COMPREHENSIVE METABOLIC PANEL
ALBUMIN: 4.2 g/dL (ref 3.5–5.0)
ALK PHOS: 49 U/L (ref 38–126)
ALT: 10 U/L (ref 0–44)
AST: 17 U/L (ref 15–41)
Anion gap: 6 (ref 5–15)
BILIRUBIN TOTAL: 0.7 mg/dL (ref 0.3–1.2)
BUN: 15 mg/dL (ref 6–20)
CALCIUM: 8.9 mg/dL (ref 8.9–10.3)
CO2: 25 mmol/L (ref 22–32)
CREATININE: 0.66 mg/dL (ref 0.44–1.00)
Chloride: 106 mmol/L (ref 98–111)
GFR calc Af Amer: >60 mL/min (ref >60)
GLUCOSE: 100 mg/dL — AB (ref 70–99)
Potassium: 3.4 mmol/L — ABNORMAL LOW (ref 3.5–5.1)
Sodium: 137 mmol/L (ref 135–145)
TOTAL PROTEIN: 7 g/dL (ref 6.5–8.1)

## 2018-12-03 LAB — URINALYSIS, MICROSCOPIC (REFLEX): RBC / HPF: >50 RBC/hpf (ref 0–5)

## 2018-12-03 LAB — PREGNANCY, URINE: Preg Test, Ur: NEGATIVE

## 2018-12-03 MED ORDER — SODIUM CHLORIDE 0.9 % IV BOLUS
1000.0000 mL | Freq: Once | INTRAVENOUS | Status: AC
  Administered 2018-12-03: 1000 mL via INTRAVENOUS

## 2018-12-03 MED ORDER — POTASSIUM CHLORIDE CRYS ER 20 MEQ PO TBCR
40.0000 meq | EXTENDED_RELEASE_TABLET | Freq: Once | ORAL | Status: AC
  Administered 2018-12-03: 40 meq via ORAL
  Filled 2018-12-03: qty 2

## 2018-12-03 MED ORDER — ONDANSETRON 4 MG PO TBDP: 4.0000 mg | ORAL_TABLET | Freq: Three times a day (TID) | ORAL | 0 refills | Status: AC | PRN

## 2018-12-03 NOTE — ED Provider Notes (Signed)
[facility] EMERGENCY DEPARTMENT Provider Note   CSN: [id number] Arrival date & time: 12/03/18  1239     History   Chief Complaint Chief Complaint  Patient presents with  . Emesis    HPI Holly Donaldson is a 38 y.o. female with no significant past medical history who presents today complaining of vomiting and decrease appetite for the past 5 days. Patient reports that she had decrease appetite since Saturday. Since Monday she has had multiple episodes of emesis usually after eating. Patient denies any abdominal pain, diarrhea, similar presentation prior or sick contact. Patient denies any recent travel or change in eating habits, no recent weight loss, vaginal discharge/bleeding, dysuria, heartburn, fever, cough, headaches. Patient endorses smoking marijuana occasionally and has not done so in the past 10 days. Patient started her menstrual cycle yesterday. Patient denies taking any NSAIDs   HPI  Past Medical History:  Diagnosis Date  . Capsulitis of right foot 04/20/2015  . Medical history non-contributory     Patient Active Problem List   Diagnosis Date Noted  . Ectopic pregnancy 06/18/2018  . Capsulitis of right foot 04/20/2015  . Pain in lower limb 04/20/2015  . Edema of right foot 04/20/2015    Past Surgical History:  Procedure Laterality Date  . NO PAST SURGERIES       OB History    Gravida  3   Para  2   Term  2   Preterm  0   AB  0   Living  2     SAB  0   TAB  0   Ectopic  0   Multiple  0   Live Births  2            Home Medications    Prior to Admission medications   Medication Sig Start Date End Date Taking? Authorizing Provider  nabumetone (RELAFEN) 500 MG tablet Take 1 tablet (500 mg total) by mouth 2 (two) times daily. 04/11/17   Sheard, Myeong O, DPM  ondansetron (ZOFRAN ODT) 4 MG disintegrating tablet Take 1 tablet (4 mg total) by mouth every 8 (eight) hours as needed for nausea or vomiting. 12/03/18   Alvira MondaySchlossman, Erin, MD    oxyCODONE-acetaminophen (PERCOCET/ROXICET) 5-325 MG tablet Take 1-2 tablets by mouth every 6 (six) hours as needed. 06/12/18   Aviva SignsWilliams, Marie L, CNM  promethazine (PHENERGAN) 25 MG tablet Take 1 tablet (25 mg total) by mouth every 6 (six) hours as needed for nausea or vomiting. 06/12/18   Aviva SignsWilliams, Marie L, CNM    Family History No family history on file.  Social History Social History   Tobacco Use  . Smoking status: Current Every Day Smoker    Packs/day: 0.50    Types: Cigarettes  . Smokeless tobacco: Never Used  Substance Use Topics  . Alcohol use: Yes    Alcohol/week: 0.0 standard drinks    Comment: occ  . Drug use: No     Allergies   Patient has no known allergies.   Review of Systems Review of Systems  Constitutional: Positive for appetite change.  HENT: Negative.   Eyes: Negative.   Respiratory: Negative.   Cardiovascular: Negative.   Gastrointestinal: Positive for nausea and vomiting.  Endocrine: Negative.   Genitourinary: Negative.      Physical Exam Updated Vital Signs BP 132/88 (BP Location: Right Arm)   Pulse 86   Temp 98.6 F (37 C) (Oral)   Resp 16   Ht 5\' 5"  (1.651 m)  Wt 63 kg   LMP 12/02/2018   SpO2 99%   BMI 23.13 kg/m   Physical Exam Constitutional:      Appearance: Normal appearance.  HENT:     Head: Normocephalic and atraumatic.     Nose: Nose normal.     Mouth/Throat:     Mouth: Mucous membranes are moist.     Pharynx: Oropharynx is clear.  Eyes:     Extraocular Movements: Extraocular movements intact.     Pupils: Pupils are equal, round, and reactive to light.  Neck:     Musculoskeletal: Normal range of motion.  Cardiovascular:     Rate and Rhythm: Normal rate and regular rhythm.     Pulses: Normal pulses.  Abdominal:     General: Abdomen is flat. There is no distension.     Tenderness: There is no abdominal tenderness. There is no guarding.     Comments: Hyperactive bowel sounds  Musculoskeletal: Normal range of  motion.  Neurological:     Mental Status: She is alert.     ED Treatments / Results  Labs (all labs ordered are listed, but only abnormal results are displayed) Labs Reviewed  URINALYSIS, ROUTINE W REFLEX MICROSCOPIC - Abnormal; Notable for the following components:      Result Value   APPearance CLOUDY (*)    Hgb urine dipstick LARGE (*)    Bilirubin Urine SMALL (*)    Protein, ur 30 (*)    Leukocytes, UA TRACE (*)    All other components within normal limits  COMPREHENSIVE METABOLIC PANEL - Abnormal; Notable for the following components:   Potassium 3.4 (*)    Glucose, Bld 100 (*)    All other components within normal limits  URINALYSIS, MICROSCOPIC (REFLEX) - Abnormal; Notable for the following components:   Bacteria, UA RARE (*)    All other components within normal limits  PREGNANCY, URINE  CBC WITH DIFFERENTIAL/PLATELET    EKG None  Radiology No results found.  Procedures Procedures (including critical care time)  Medications Ordered in ED Medications  sodium chloride 0.9 % bolus 1,000 mL (0 mLs Intravenous Stopped 12/03/18 1446)  potassium chloride SA (K-DUR,KLOR-CON) CR tablet 40 mEq (40 mEq Oral Given 12/03/18 1442)     Initial Impression / Assessment and Plan / ED Course  I have reviewed the triage vital signs and the nursing notes.  Pertinent labs & imaging results that were available during my care of the patient were reviewed by me and considered in my medical decision making (see chart for details).   Patient is a 38 yo female who presents today multiple episodes of emesis and decrease appetite for the past week. Patient denies abdominal pain, fever, GU symptoms. CBC and CMP were unremarkable except for mild hypokalemia in the setting of emesis. Urine pregnancy was negative and UA did not show any sign of infection. Physical exam was unremarkable. Patient received a fluid bolus of NS. Our differential would include viral gastroenteritis, gastritis,  appendicitis, cystitis, PID, cannaboid induced emesis. Lab results and exam without any red flags, suspect symptoms are likely viral gastroenteritis. She was stable on discharge and was prescribed zofran. Patient asked to follow up with PCP if symptoms do not resolve in the next few days. Kdur given. Patient was in stable condition prior to discharge and is in agreement with plan.  Final Clinical Impressions(s) / ED Diagnoses   Final diagnoses:  Non-intractable vomiting with nausea, unspecified vomiting type    ED Discharge Orders  Ordered    ondansetron (ZOFRAN ODT) 4 MG disintegrating tablet  Every 8 hours PRN     12/03/18 1428           Holly Donaldson, Lilia Argue, MD 12/03/18 1447    Alvira Monday, MD 12/05/18 [phone]

## 2018-12-03 NOTE — ED Triage Notes (Signed)
C/o n/v x 3 days-NAD-steady gait 

## 2019-05-24 IMAGING — US US OB TRANSVAGINAL
1 series · 15 of 28 positions shown · non-contrast
Comparison: 06/12/2018

CLINICAL DATA: Followup left sided ectopic pregnancy. Recently
treated with methotrexate, but less than expected decrease in
quantitative beta HCG levels.

EXAM:
TRANSVAGINAL OB ULTRASOUND
TECHNIQUE: Transvaginal ultrasound was performed for complete evaluation of the
gestation as well as the maternal uterus, adnexal regions, and
pelvic cul-de-sac.

[Series 1: us ob transvaginal · 41 acquisitions, 15 frames shown]
[im 1/41]
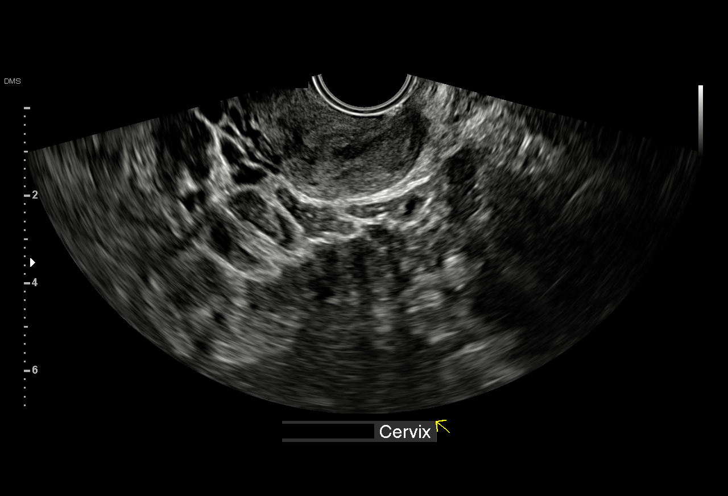
[im 3/41]
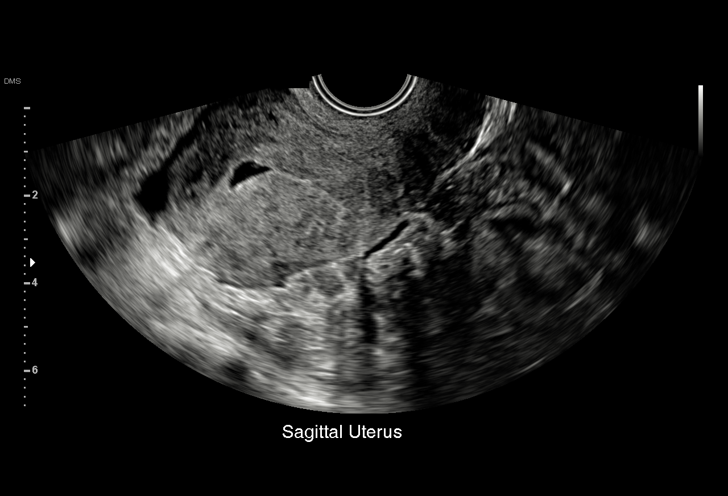
[im 6/41]
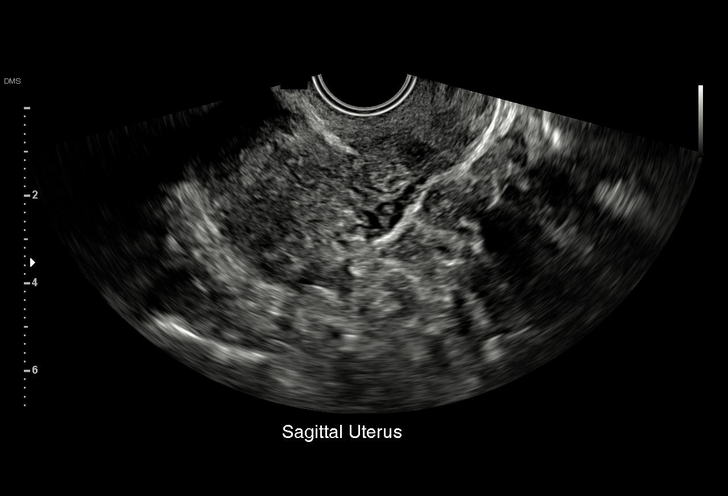
[im 9/41]
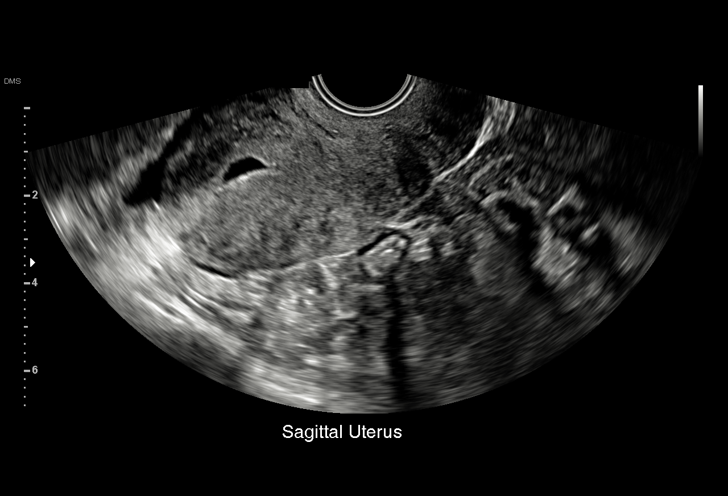
[im 12/41]
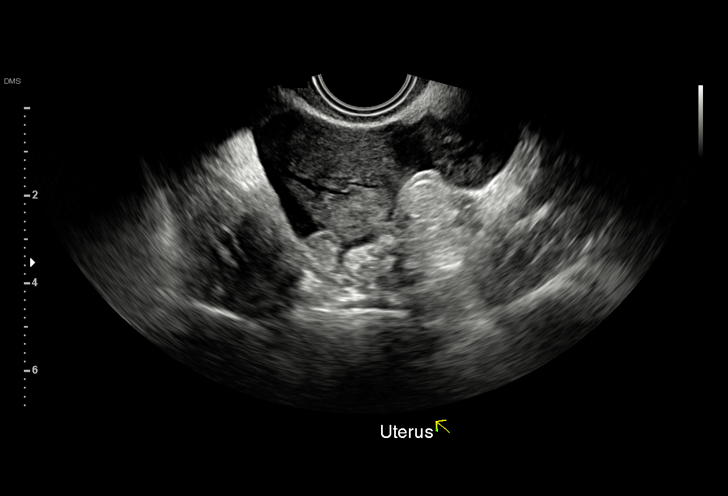
[im 15/41]
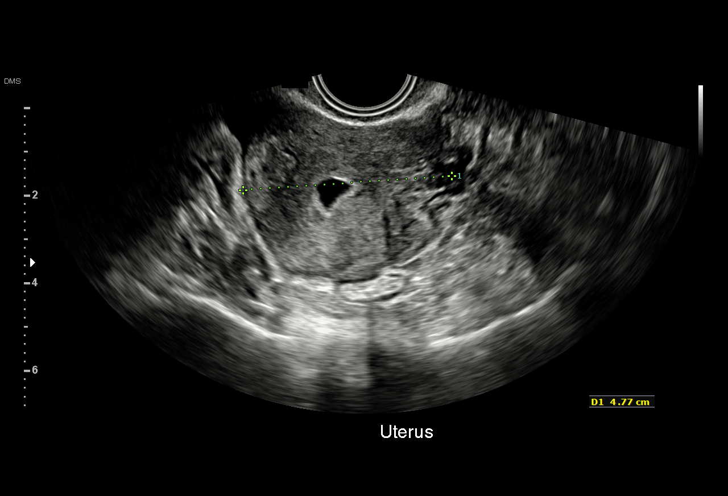
[im 18/41]
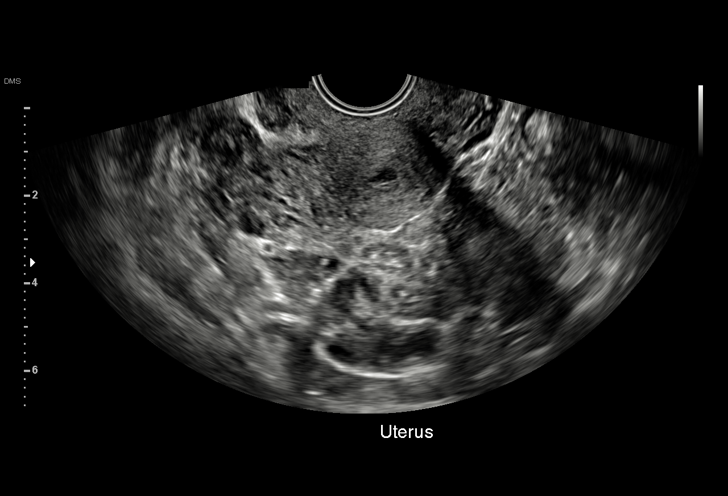
[im 21/41]
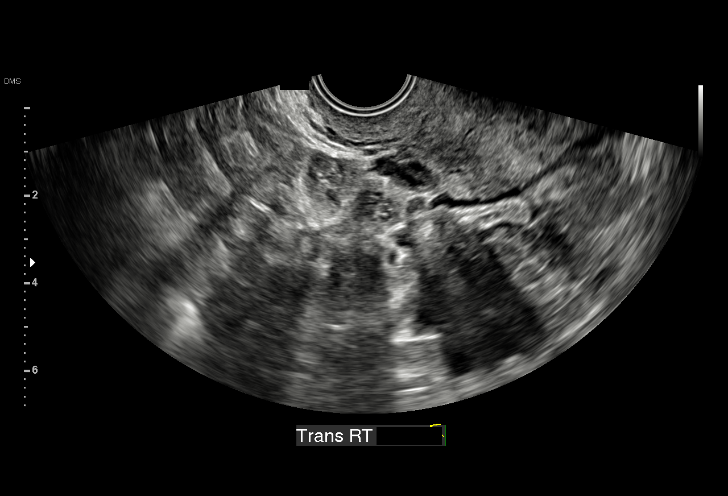
[im 23/41]
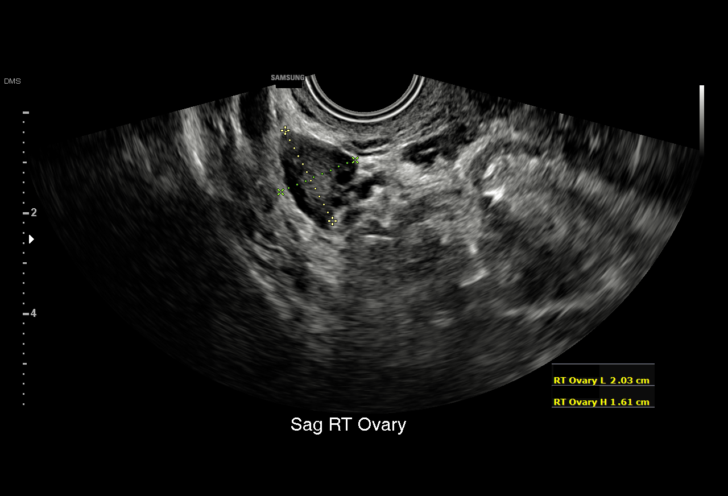
[im 26/41]
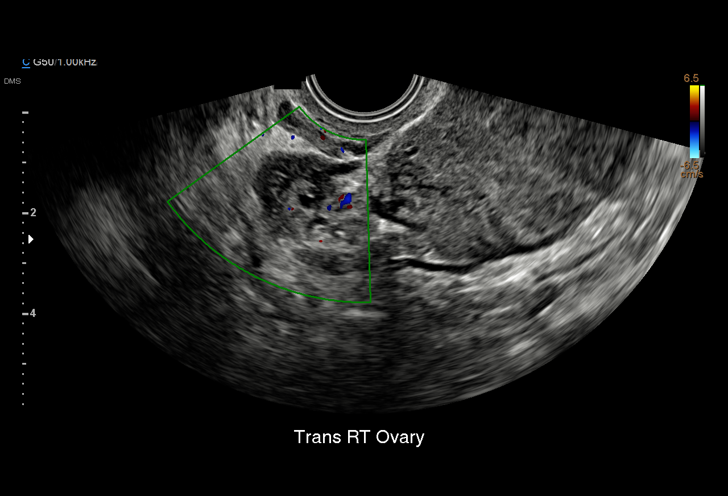
[im 29/41]
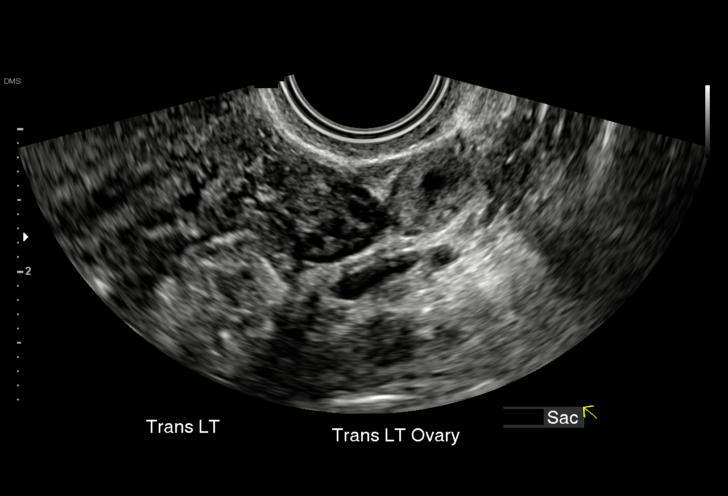
[im 32/41]
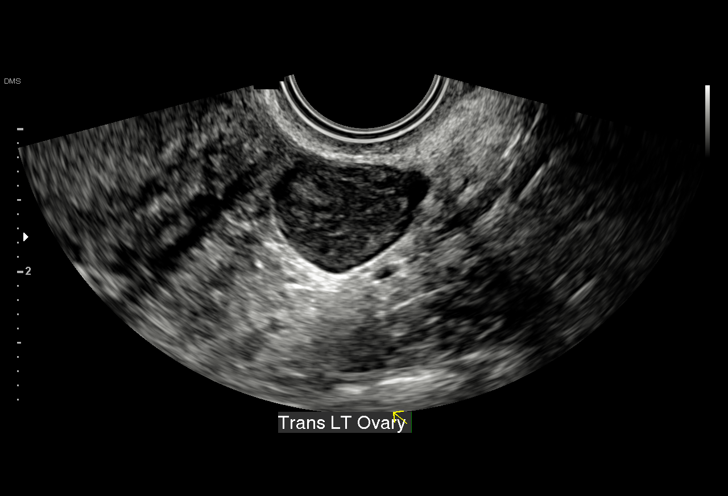
[im 35/41]
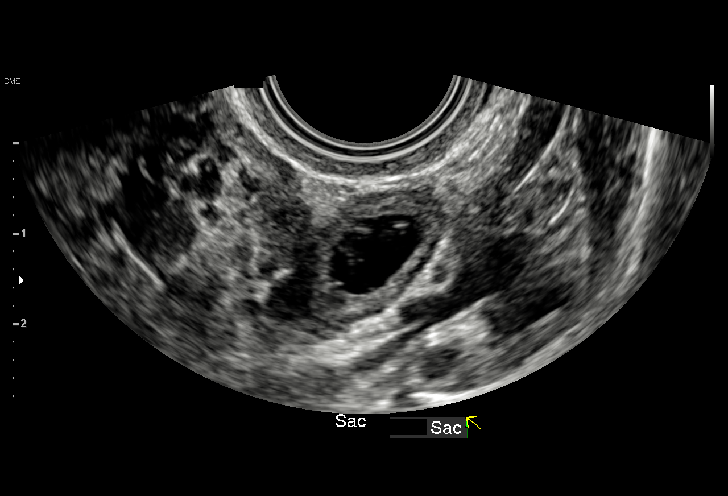
[im 38/41]
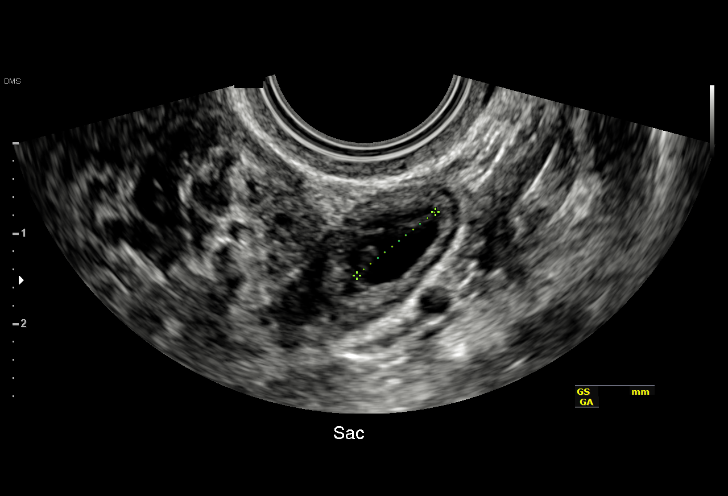
[im 41/41]
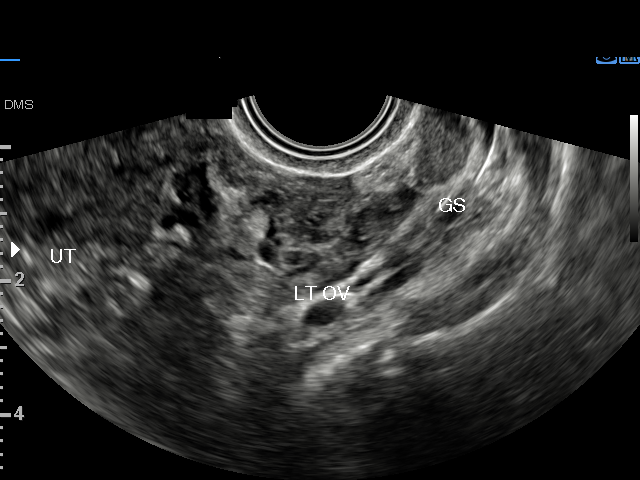

[15 of 28 positions shown; findings below may reference images not displayed]

FINDINGS: Intrauterine gestational sac: None

Maternal uterus/adnexae: Both ovaries are normal in appearance. An
ectopic gestational sac is seen in the left adnexa which contains
some echogenic debris. This measures 2.0 x 1.2 x 1.1 cm, without
significant change since previous study. A small amount of simple
free fluid is seen, which is new since previous study.
IMPRESSION: Persistent ectopic pregnancy in the left adnexa, measuring 2.0 cm.
Small amount of simple free fluid noted on today's exam.

Critical Value/emergent results were called by telephone at the time
of interpretation on 06/18/2018 at [DATE] to the OB clinic nurse
Kaarit, who verbally acknowledged these results.

## 2019-11-25 IMAGING — US US OB COMP LESS 14 WK
1 series · 14 of 28 positions shown · non-contrast
Comparison: None.

CLINICAL DATA: Pelvic pain, cramping, and vaginal bleeding for 3
days. Gestational age by LMP of 7 weeks 2 days.

EXAM:
OBSTETRIC <14 WK US AND TRANSVAGINAL OB US
TECHNIQUE: Both transabdominal and transvaginal ultrasound examinations were
performed for complete evaluation of the gestation as well as the
maternal uterus, adnexal regions, and pelvic cul-de-sac.
Transvaginal technique was performed to assess early pregnancy.

[Series 1: us ob comp less 14 wk · 0.18mm/px · 77 acquisitions, 14 frames shown]
[im 3/77]
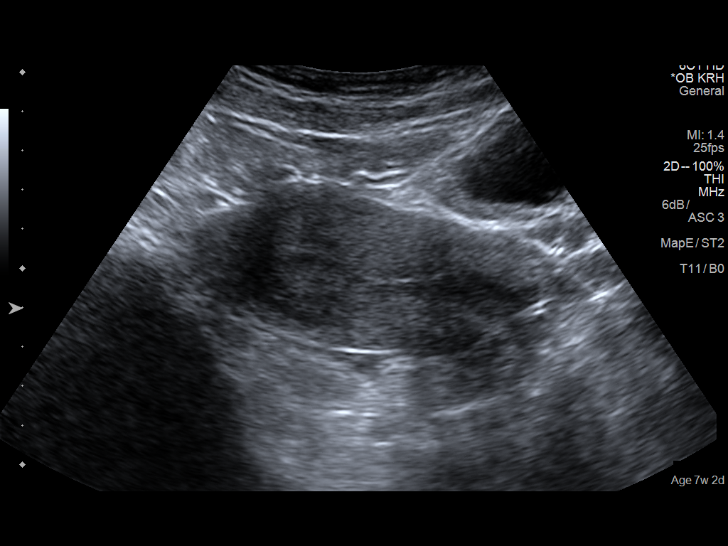
[im 9/77]
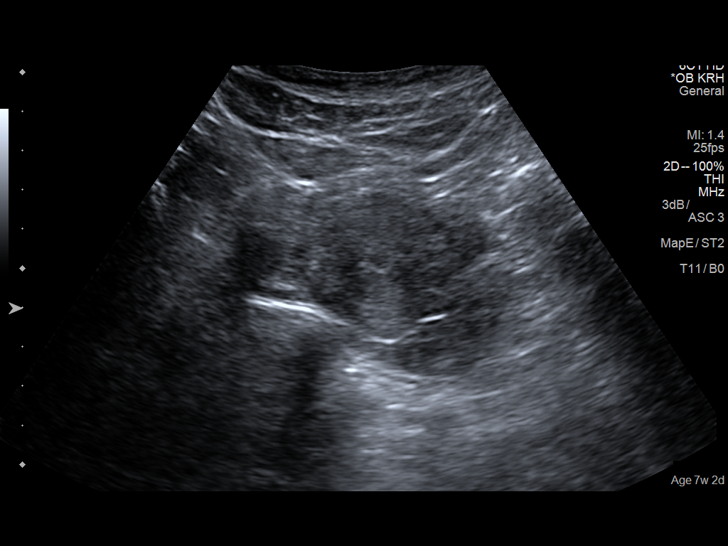
[im 15/77]
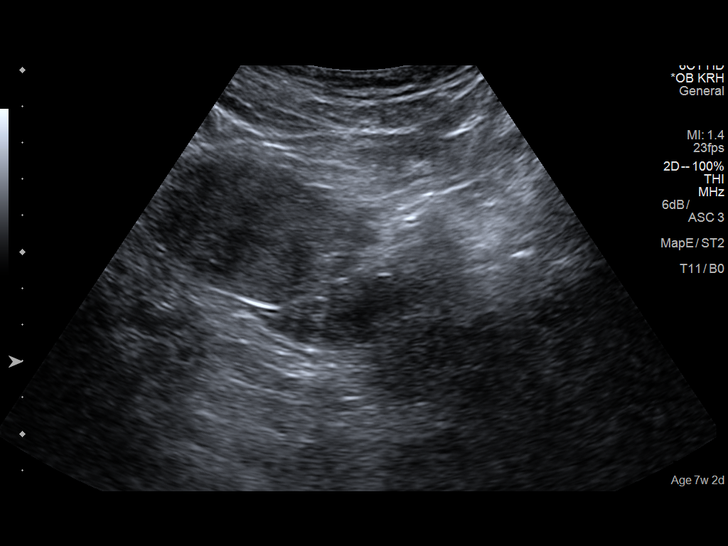
[im 20/77]
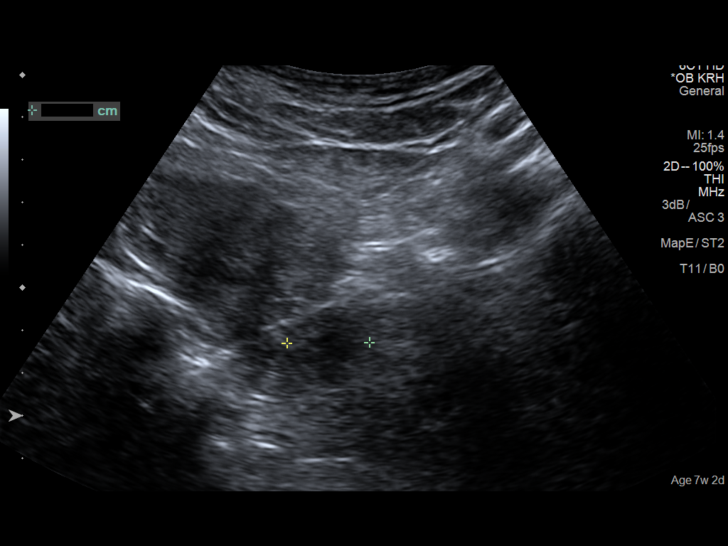
[im 26/77]
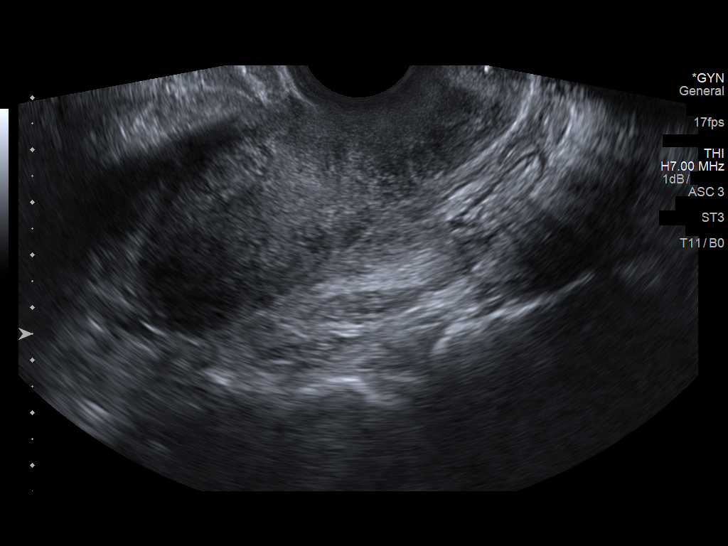
[im 31/77]
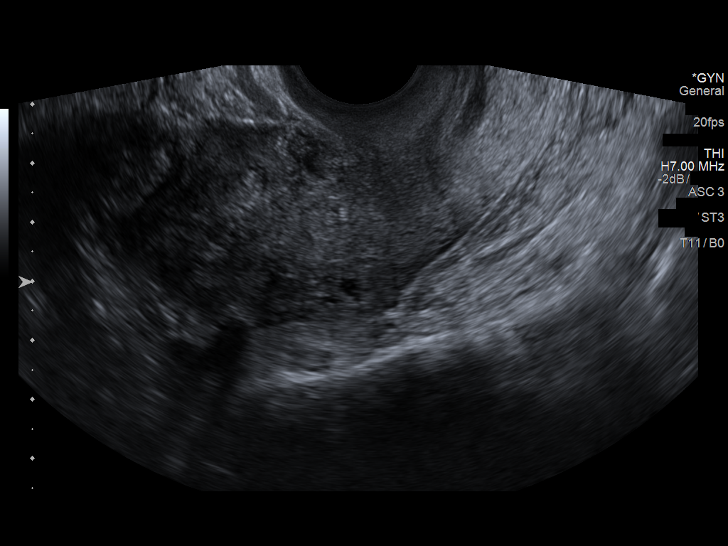
[im 37/77]
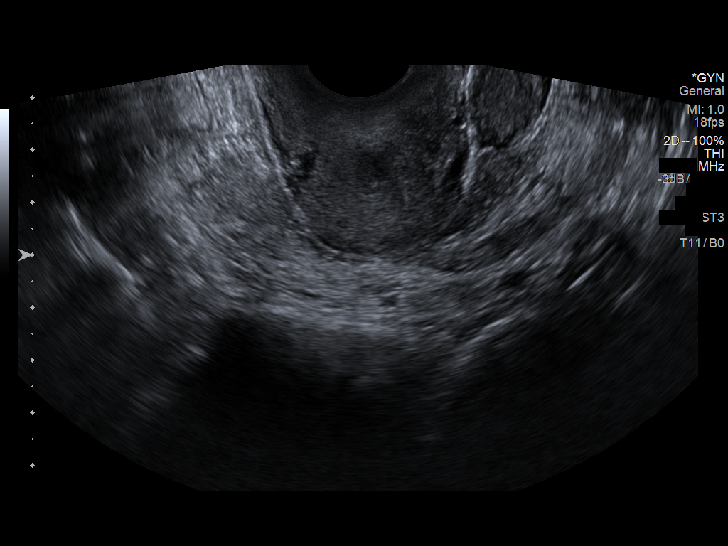
[im 43/77]
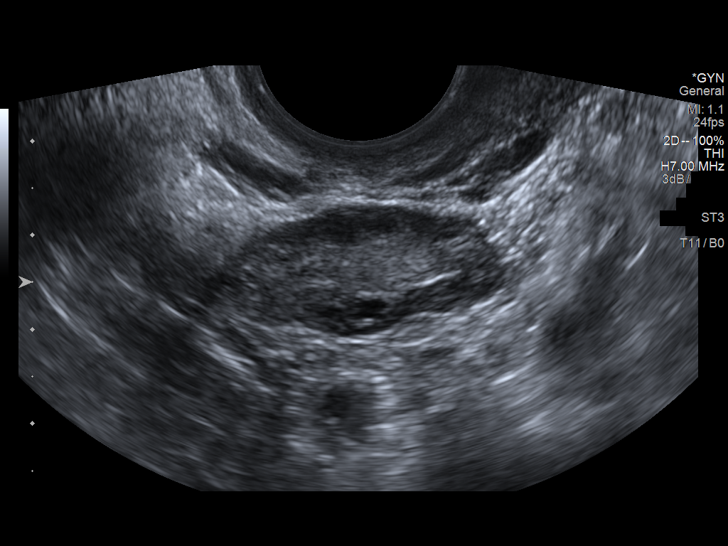
[im 48/77]
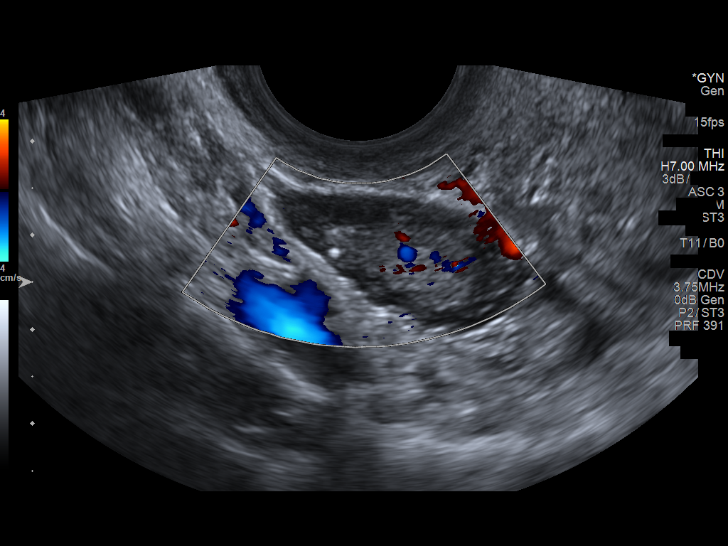
[im 54/77]
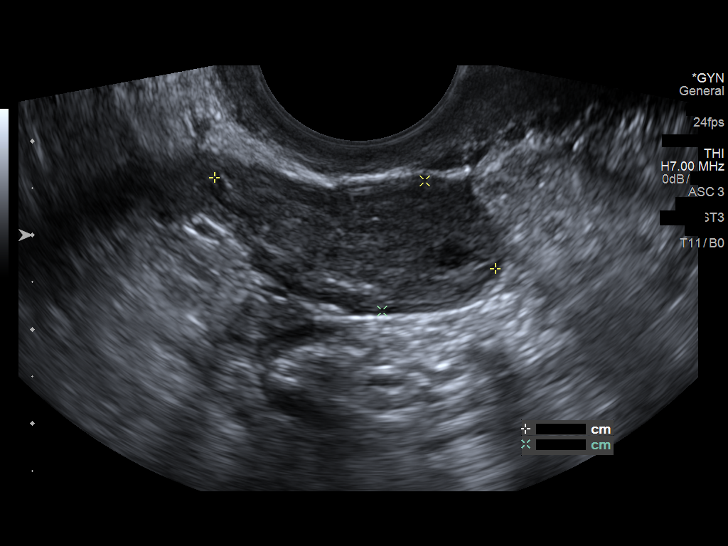
[im 60/77]
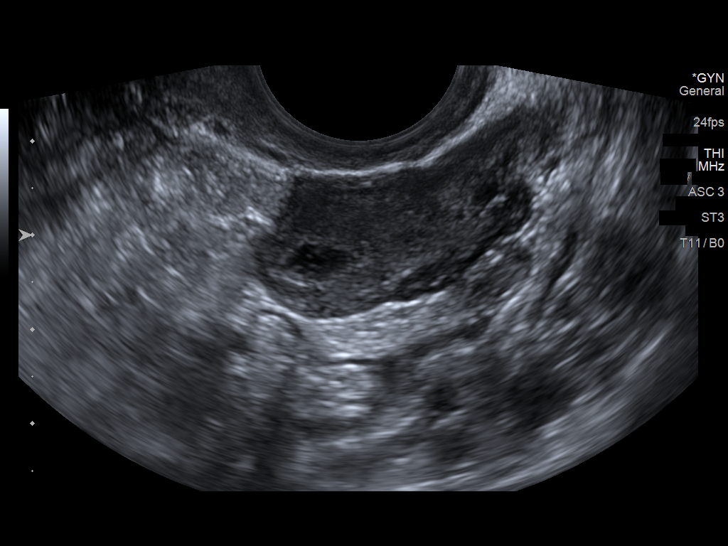
[im 65/77]
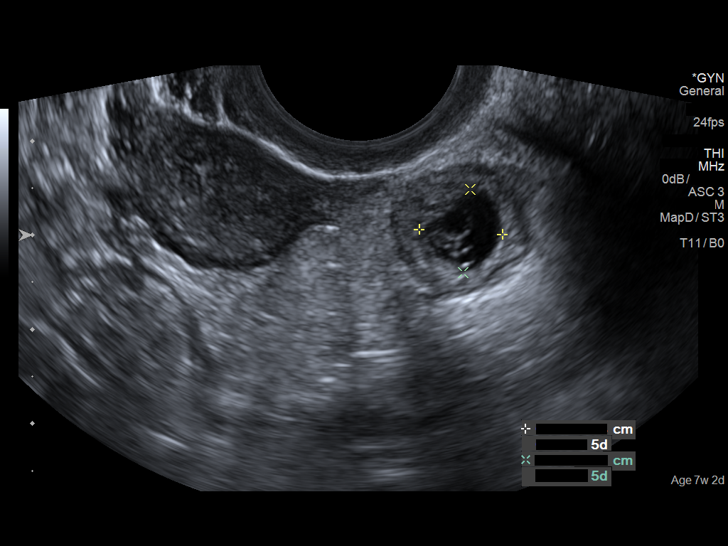
[im 71/77]
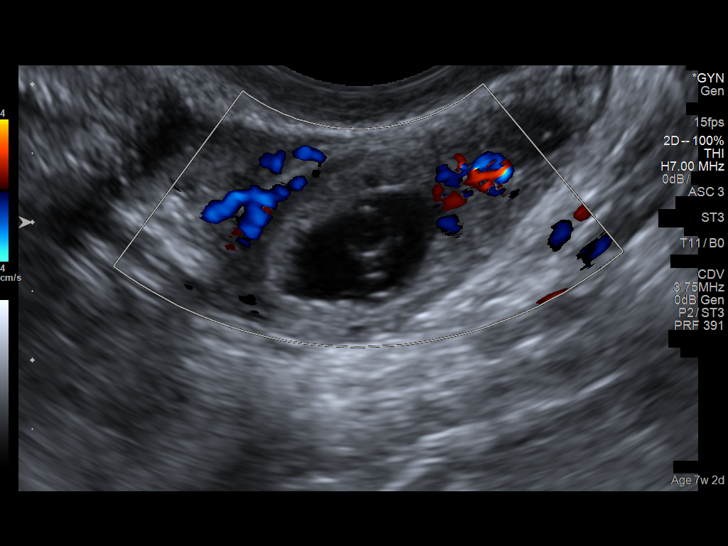
[im 77/77]
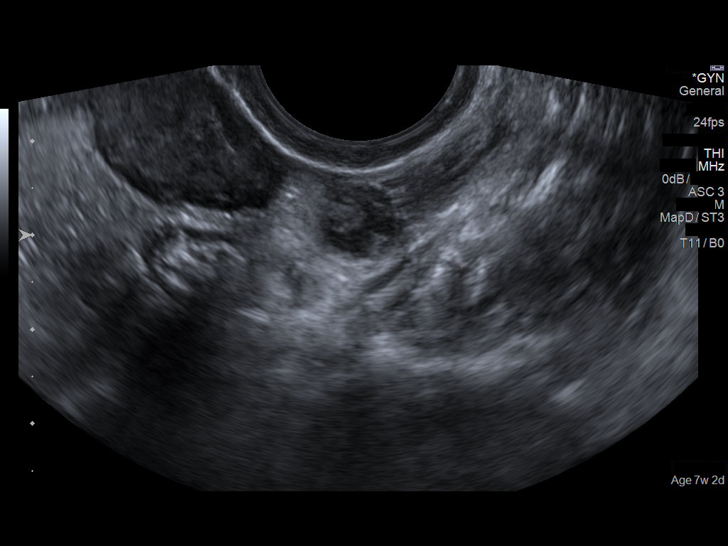

[14 of 28 positions shown; findings below may reference images not displayed]

FINDINGS: Intrauterine gestational sac: None

Maternal uterus/adnexae: A gestational sac is seen in the left
adnexa adjacent to the ovary which contains a yolk sac and embryo.
Embryonic crown-rump length measures 5 mm, corresponding to
gestational age of 6 weeks 1 day. No embryonic cardiac activity is
visualized. Overall measurements of the gestation are 1.9 x 1.3 x
1.8 cm. No evidence of hemoperitoneum.
IMPRESSION: Ectopic pregnancy in left adnexa, as described above.

No evidence of hemoperitoneum.

Critical Value/emergent results were called by telephone at the time
of interpretation on 06/12/2018 at [DATE] to Dr. Paulus N in the ED, who
verbally acknowledged these results.

## 2022-03-14 ENCOUNTER — Ambulatory Visit: Payer: Medicaid Other | Attending: Physician Assistant

## 2022-03-14 DIAGNOSIS — R293 Abnormal posture: Secondary | ICD-10-CM | POA: Insufficient documentation

## 2022-03-14 NOTE — Therapy (Signed)
?OUTPATIENT PHYSICAL THERAPY SHOULDER EVALUATION ? ? ?Patient Name: Holly Donaldson ?MRN: [id number] ?DOB:18-Mar-1981, 41 y.o., female ?Today's Date: 03/14/2022 ? ? ? ?Past Medical History:  ?Diagnosis Date  ? Capsulitis of right foot 04/20/2015  ? Medical history non-contributory   ? ?Past Surgical History:  ?Procedure Laterality Date  ? NO PAST SURGERIES    ? ?Patient Active Problem List  ? Diagnosis Date Noted  ? Ectopic pregnancy 06/18/2018  ? Capsulitis of right foot 04/20/2015  ? Pain in lower limb 04/20/2015  ? Edema of right foot 04/20/2015  ? ? ?PCP: Patient, No Pcp Per (Inactive) ? ?REFERRING PROVIDER: Trey Sailors, PA ? ?REFERRING DIAG: left shoulder pain  ? ?THERAPY DIAG: L shoulder pain ? ? ?ONSET DATE: 10/2021 ? ?SUBJECTIVE:                                                                                                                                                                                     ? ?SUBJECTIVE STATEMENT: ?Describes a long history of L shoulder numbness following and episode of shingles, denies pain, loss of mobility or weakness ? ?PERTINENT HISTORY: ?Chronic L shoulder pain ongoing since 12/22, previous assessment from [facility] ? ?PAIN:  ?Are you having pain? No ? ?PRECAUTIONS: None ? ?WEIGHT BEARING RESTRICTIONS No ? ?FALLS:  ?Has patient fallen in last 6 months? No ? ?LIVING ENVIRONMENT: ?Lives with: lives with their family ?Lives in: House/apartment ? ? 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 ?PLOF: Independent ? ?PATIENT GOALS To resolve this numbness ? ?OBJECTIVE:  ? ?DIAGNOSTIC FINDINGS:  ?None noted ? ?PATIENT SURVEYS:  ?DASH score 28 with patient rating based on concurrent R wrist pain form suspected ganglion cyst ? ?COGNITION: ? Overall cognitive status: Within functional limits for tasks assessed ?    ?SENSATION: ?WFL ? ?POSTURE: ?Mildly  ? ?UPPER EXTREMITY ROM: WNL ? ?A/PROM Right ?03/14/2022 Left ?03/14/2022  ?Shoulder flexion    ?Shoulder extension    ?Shoulder abduction     ?Shoulder adduction    ?Shoulder internal rotation    ?Shoulder external rotation    ?Elbow flexion    ?Elbow extension    ?Wrist flexion    ?Wrist extension    ?Wrist ulnar deviation    ?Wrist radial deviation    ?Wrist pronation    ?Wrist supination    ?(Blank rows = not tested) ? ?UPPER EXTREMITY MMT: WNL ? ?MMT Right ?03/14/2022 Left ?03/14/2022  ?Shoulder flexion    ?Shoulder extension    ?Shoulder abduction    ?Shoulder adduction    ?Shoulder internal rotation    ?Shoulder external rotation    ?Middle trapezius    ?Lower trapezius    ?  Elbow flexion    ?Elbow extension    ?Wrist flexion    ?Wrist extension    ?Wrist ulnar deviation    ?Wrist radial deviation    ?Wrist pronation    ?Wrist supination    ?Grip strength (lbs) =B =B  ?(Blank rows = not tested) ? ?SHOULDER SPECIAL TESTS: ? Impingement tests: Neer impingement test: negative, Hawkins/Kennedy impingement test: negative, and Painful arc test: negative ? ? ?PALPATION:  ?Unremarkable ?  ?TODAY'S TREATMENT:  ?Eval and HEP ? ? ?PATIENT EDUCATION: ?Education details: Discussed eval findings, rehab rationale and POC and patient is in agreement  ?Person educated: Patient ?Education method: Explanation, Demonstration, and Handouts ?Education comprehension: verbalized understanding, returned demonstration, and needs further education ? ? ?HOME EXERCISE PROGRAM: ?Access Code: [email or web address] ?URL: [email or web address] ?Date: 03/14/2022 ?Prepared by: Sharlynn Oliphant ? ?Exercises ?- Prone Shoulder Horizontal Abduction  - 1 x daily - 7 x weekly - 2 sets - 10 reps ?- Prone Single Arm Shoulder Y  - 1 x daily - 7 x weekly - 2 sets - 10 reps ?- Prone Shoulder Extension  - 1 x daily - 7 x weekly - 2 sets - 10 reps ?- Prone W Scapular Retraction  - 1 x daily - 7 x weekly - 2 sets - 10 reps ? ?ASSESSMENT: ? ?CLINICAL IMPRESSION: ?Patient is a 41 y.o. female who was seen today for physical therapy evaluation and treatment for L shoulder numbness.  She denies pain,  shows full AROM and strength, negative signs of instability, mild forward L shoulder.  Grip equal B ? ? ?OBJECTIVE IMPAIRMENTS postural dysfunction.  ? ? ?REHAB POTENTIAL: Good ? ?CLINICAL DECISION MAKING: Stable/uncomplicated ? ?EVALUATION COMPLEXITY: Low ? ? ?GOALS: ?Goals reviewed with patient? Yes ? ?SHORT TERM GOALS: Target date: 03/28/2022 ? ?Patient to demonstrate independence in HEP  ?Baseline:Access Code: [email or web address] ?URL: [email or web address] ?Date: 03/14/2022 ?Prepared by: Sharlynn Oliphant ? ?Exercises ?- Prone Shoulder Horizontal Abduction  - 1 x daily - 7 x weekly - 2 sets - 10 reps ?- Prone Single Arm Shoulder Y  - 1 x daily - 7 x weekly - 2 sets - 10 reps ?- Prone Shoulder Extension  - 1 x daily - 7 x weekly - 2 sets - 10 reps ?- Prone W Scapular Retraction  - 1 x daily - 7 x weekly - 2 sets - 10 reps ?Goal status: MET ? ? ? ? ?PLAN: ?PT FREQUENCY: one time visit ? ? ? ?PLANNED INTERVENTIONS: Therapeutic exercises ? ?PLAN FOR NEXT SESSION: DC to HEP, consider imaging studies if symptoms persist or worsen. ? ? ?Lanice Shirts, PT ?03/14/2022, 12:04 PM  ?

## 2022-09-27 ENCOUNTER — Other Ambulatory Visit: Payer: Self-pay | Admitting: Physician Assistant

## 2022-09-27 DIAGNOSIS — R102 Pelvic and perineal pain: Secondary | ICD-10-CM

## 2022-10-02 ENCOUNTER — Ambulatory Visit
Admission: RE | Admit: 2022-10-02 | Discharge: 2022-10-02 | Disposition: A | Discharge status: Home / Self Care | Payer: Medicaid Other | Source: Ambulatory Visit | Attending: Physician Assistant | Admitting: Physician Assistant

## 2022-10-02 DIAGNOSIS — R102 Pelvic and perineal pain: Secondary | ICD-10-CM

## 2023-12-12 ENCOUNTER — Other Ambulatory Visit: Payer: Self-pay | Admitting: Physician Assistant

## 2023-12-12 DIAGNOSIS — Z1231 Encounter for screening mammogram for malignant neoplasm of breast: Secondary | ICD-10-CM

## 2023-12-18 ENCOUNTER — Ambulatory Visit
Admission: RE | Admit: 2023-12-18 | Discharge: 2023-12-18 | Disposition: A | Discharge status: Home / Self Care | Payer: Medicaid Other | Source: Ambulatory Visit | Attending: Physician Assistant | Admitting: Physician Assistant

## 2023-12-18 DIAGNOSIS — Z1231 Encounter for screening mammogram for malignant neoplasm of breast: Secondary | ICD-10-CM

## 2023-12-20 ENCOUNTER — Other Ambulatory Visit: Payer: Self-pay | Admitting: Physician Assistant

## 2023-12-20 DIAGNOSIS — R928 Other abnormal and inconclusive findings on diagnostic imaging of breast: Secondary | ICD-10-CM

## 2023-12-28 ENCOUNTER — Ambulatory Visit
Admission: RE | Admit: 2023-12-28 | Discharge: 2023-12-28 | Disposition: A | Discharge status: Home / Self Care | Payer: Medicaid Other | Source: Ambulatory Visit | Attending: Physician Assistant | Admitting: Physician Assistant

## 2023-12-28 ENCOUNTER — Other Ambulatory Visit: Payer: Self-pay | Admitting: Physician Assistant

## 2023-12-28 DIAGNOSIS — R928 Other abnormal and inconclusive findings on diagnostic imaging of breast: Secondary | ICD-10-CM

## 2023-12-28 DIAGNOSIS — N6320 Unspecified lump in the left breast, unspecified quadrant: Secondary | ICD-10-CM

## 2024-01-03 ENCOUNTER — Ambulatory Visit
Admission: RE | Admit: 2024-01-03 | Discharge: 2024-01-03 | Disposition: A | Discharge status: Home / Self Care | Payer: Medicaid Other | Source: Ambulatory Visit | Attending: Physician Assistant | Admitting: Physician Assistant

## 2024-01-03 DIAGNOSIS — N6320 Unspecified lump in the left breast, unspecified quadrant: Secondary | ICD-10-CM

## 2024-01-03 DIAGNOSIS — R928 Other abnormal and inconclusive findings on diagnostic imaging of breast: Secondary | ICD-10-CM

## 2024-01-03 HISTORY — PX: BREAST BIOPSY: SHX20

## 2024-01-06 LAB — SURGICAL PATHOLOGY
